# Patient Record
Sex: Female | Born: 1960 | Race: Black or African American | Hispanic: No | Marital: Single | Smoking: Former smoker
Health system: Southern US, Community
[De-identification: ages and names within clinical notes are randomized; demographics above are authoritative.]

## PROBLEM LIST (undated history)

## (undated) DIAGNOSIS — E213 Hyperparathyroidism, unspecified: Secondary | ICD-10-CM

## (undated) DIAGNOSIS — M1990 Unspecified osteoarthritis, unspecified site: Secondary | ICD-10-CM

## (undated) DIAGNOSIS — E559 Vitamin D deficiency, unspecified: Secondary | ICD-10-CM

## (undated) DIAGNOSIS — J45909 Unspecified asthma, uncomplicated: Secondary | ICD-10-CM

## (undated) DIAGNOSIS — E039 Hypothyroidism, unspecified: Secondary | ICD-10-CM

## (undated) DIAGNOSIS — F32A Depression, unspecified: Secondary | ICD-10-CM

## (undated) DIAGNOSIS — F329 Major depressive disorder, single episode, unspecified: Secondary | ICD-10-CM

---

## 2007-06-05 ENCOUNTER — Encounter: Admission: RE | Admit: 2007-06-05 | Discharge: 2007-06-05 | Payer: Self-pay | Admitting: Internal Medicine

## 2013-04-29 ENCOUNTER — Encounter (HOSPITAL_COMMUNITY): Payer: No Typology Code available for payment source | Admitting: Anesthesiology

## 2013-04-29 ENCOUNTER — Encounter (HOSPITAL_COMMUNITY): Payer: Self-pay | Admitting: Emergency Medicine

## 2013-04-29 ENCOUNTER — Inpatient Hospital Stay (HOSPITAL_COMMUNITY): Payer: No Typology Code available for payment source

## 2013-04-29 ENCOUNTER — Inpatient Hospital Stay (HOSPITAL_COMMUNITY)
Admission: EM | Admit: 2013-04-29 | Discharge: 2013-05-05 | DRG: 493 | Disposition: A | Discharge status: Rehabilitation Facility | Payer: No Typology Code available for payment source | Attending: General Surgery | Admitting: General Surgery

## 2013-04-29 ENCOUNTER — Emergency Department (HOSPITAL_COMMUNITY): Payer: No Typology Code available for payment source

## 2013-04-29 ENCOUNTER — Encounter (HOSPITAL_COMMUNITY): Admission: EM | Disposition: A | Discharge status: Rehabilitation Facility | Payer: Self-pay | Source: Home / Self Care

## 2013-04-29 ENCOUNTER — Inpatient Hospital Stay (HOSPITAL_COMMUNITY): Payer: No Typology Code available for payment source | Admitting: Anesthesiology

## 2013-04-29 DIAGNOSIS — S060XAA Concussion with loss of consciousness status unknown, initial encounter: Secondary | ICD-10-CM

## 2013-04-29 DIAGNOSIS — D62 Acute posthemorrhagic anemia: Secondary | ICD-10-CM | POA: Diagnosis present

## 2013-04-29 DIAGNOSIS — S2249XA Multiple fractures of ribs, unspecified side, initial encounter for closed fracture: Secondary | ICD-10-CM

## 2013-04-29 DIAGNOSIS — S060X9A Concussion with loss of consciousness of unspecified duration, initial encounter: Secondary | ICD-10-CM

## 2013-04-29 DIAGNOSIS — S32509A Unspecified fracture of unspecified pubis, initial encounter for closed fracture: Secondary | ICD-10-CM | POA: Diagnosis present

## 2013-04-29 DIAGNOSIS — S32592A Other specified fracture of left pubis, initial encounter for closed fracture: Secondary | ICD-10-CM

## 2013-04-29 DIAGNOSIS — S2241XA Multiple fractures of ribs, right side, initial encounter for closed fracture: Secondary | ICD-10-CM

## 2013-04-29 DIAGNOSIS — S02609A Fracture of mandible, unspecified, initial encounter for closed fracture: Secondary | ICD-10-CM

## 2013-04-29 DIAGNOSIS — S82409A Unspecified fracture of shaft of unspecified fibula, initial encounter for closed fracture: Secondary | ICD-10-CM

## 2013-04-29 DIAGNOSIS — S82209A Unspecified fracture of shaft of unspecified tibia, initial encounter for closed fracture: Secondary | ICD-10-CM | POA: Diagnosis present

## 2013-04-29 DIAGNOSIS — S02109A Fracture of base of skull, unspecified side, initial encounter for closed fracture: Secondary | ICD-10-CM | POA: Diagnosis present

## 2013-04-29 DIAGNOSIS — S02650A Fracture of angle of mandible, unspecified side, initial encounter for closed fracture: Secondary | ICD-10-CM | POA: Diagnosis present

## 2013-04-29 DIAGNOSIS — S0280XA Fracture of other specified skull and facial bones, unspecified side, initial encounter for closed fracture: Secondary | ICD-10-CM | POA: Diagnosis present

## 2013-04-29 DIAGNOSIS — Y92410 Unspecified street and highway as the place of occurrence of the external cause: Secondary | ICD-10-CM

## 2013-04-29 DIAGNOSIS — S01501A Unspecified open wound of lip, initial encounter: Secondary | ICD-10-CM | POA: Diagnosis present

## 2013-04-29 DIAGNOSIS — S02400A Malar fracture unspecified, initial encounter for closed fracture: Secondary | ICD-10-CM | POA: Diagnosis present

## 2013-04-29 DIAGNOSIS — S82251A Displaced comminuted fracture of shaft of right tibia, initial encounter for closed fracture: Secondary | ICD-10-CM

## 2013-04-29 DIAGNOSIS — S329XXA Fracture of unspecified parts of lumbosacral spine and pelvis, initial encounter for closed fracture: Secondary | ICD-10-CM

## 2013-04-29 DIAGNOSIS — N9489 Other specified conditions associated with female genital organs and menstrual cycle: Secondary | ICD-10-CM

## 2013-04-29 DIAGNOSIS — S82201A Unspecified fracture of shaft of right tibia, initial encounter for closed fracture: Secondary | ICD-10-CM

## 2013-04-29 DIAGNOSIS — S060X1A Concussion with loss of consciousness of 30 minutes or less, initial encounter: Secondary | ICD-10-CM

## 2013-04-29 DIAGNOSIS — S0292XA Unspecified fracture of facial bones, initial encounter for closed fracture: Secondary | ICD-10-CM

## 2013-04-29 DIAGNOSIS — S02401A Maxillary fracture, unspecified, initial encounter for closed fracture: Secondary | ICD-10-CM | POA: Diagnosis present

## 2013-04-29 DIAGNOSIS — IMO0002 Reserved for concepts with insufficient information to code with codable children: Secondary | ICD-10-CM | POA: Diagnosis present

## 2013-04-29 DIAGNOSIS — S21301A Unspecified open wound of right front wall of thorax with penetration into thoracic cavity, initial encounter: Secondary | ICD-10-CM

## 2013-04-29 DIAGNOSIS — Z79899 Other long term (current) drug therapy: Secondary | ICD-10-CM

## 2013-04-29 DIAGNOSIS — S82109A Unspecified fracture of upper end of unspecified tibia, initial encounter for closed fracture: Secondary | ICD-10-CM | POA: Diagnosis present

## 2013-04-29 DIAGNOSIS — J45909 Unspecified asthma, uncomplicated: Secondary | ICD-10-CM | POA: Diagnosis present

## 2013-04-29 DIAGNOSIS — M79609 Pain in unspecified limb: Secondary | ICD-10-CM | POA: Diagnosis present

## 2013-04-29 HISTORY — DX: Unspecified asthma, uncomplicated: J45.909

## 2013-04-29 HISTORY — DX: Unspecified fracture of facial bones, initial encounter for closed fracture: S02.92XA

## 2013-04-29 HISTORY — DX: Multiple fractures of ribs, right side, initial encounter for closed fracture: S22.41XA

## 2013-04-29 HISTORY — DX: Fracture of unspecified parts of lumbosacral spine and pelvis, initial encounter for closed fracture: S32.9XXA

## 2013-04-29 HISTORY — DX: Unspecified fracture of shaft of right tibia, initial encounter for closed fracture: S82.201A

## 2013-04-29 HISTORY — DX: Concussion with loss of consciousness status unknown, initial encounter: S06.0XAA

## 2013-04-29 HISTORY — PX: ORIF PELVIC FRACTURE: SHX2128

## 2013-04-29 HISTORY — PX: EXTERNAL FIXATION LEG: SHX1549

## 2013-04-29 LAB — COMPREHENSIVE METABOLIC PANEL
ALT: 31 U/L (ref 0–35)
Albumin: 3.4 g/dL — ABNORMAL LOW (ref 3.5–5.2)
Alkaline Phosphatase: 87 U/L (ref 39–117)
Calcium: 8.9 mg/dL (ref 8.4–10.5)
Chloride: 103 mEq/L (ref 96–112)
Creatinine, Ser: 0.62 mg/dL (ref 0.50–1.10)
GFR calc Af Amer: >90 mL/min (ref >90)
GFR calc non Af Amer: >90 mL/min (ref >90)
Glucose, Bld: 171 mg/dL — ABNORMAL HIGH (ref 70–99)
Total Bilirubin: 0.3 mg/dL (ref 0.3–1.2)
Total Protein: 6.8 g/dL (ref 6.0–8.3)

## 2013-04-29 LAB — CBC WITH DIFFERENTIAL/PLATELET
Basophils Relative: 0 % (ref 0–1)
HCT: 34.8 % — ABNORMAL LOW (ref 36.0–46.0)
MCH: 29.4 pg (ref 26.0–34.0)
MCHC: 33.6 g/dL (ref 30.0–36.0)
MCV: 87.4 fL (ref 78.0–100.0)
Monocytes Relative: 5 % (ref 3–12)
Neutro Abs: 5.5 10*3/uL (ref 1.7–7.7)
Neutrophils Relative %: 60 % (ref 43–77)
RBC: 3.98 MIL/uL (ref 3.87–5.11)
WBC: 9.1 10*3/uL (ref 4.0–10.5)

## 2013-04-29 LAB — MRSA PCR SCREENING: MRSA by PCR: NEGATIVE

## 2013-04-29 LAB — POCT I-STAT, CHEM 8
Calcium, Ion: 1.24 mmol/L — ABNORMAL HIGH (ref 1.12–1.23)
Sodium: 140 mEq/L (ref 135–145)
TCO2: 25 mmol/L (ref 0–100)

## 2013-04-29 SURGERY — OPEN REDUCTION INTERNAL FIXATION (ORIF) PELVIC FRACTURE
Anesthesia: General | Site: Leg Lower | Laterality: Right | Wound class: Clean

## 2013-04-29 MED ORDER — IOHEXOL 300 MG/ML  SOLN
100.0000 mL | Freq: Once | INTRAMUSCULAR | Status: AC | PRN
  Administered 2013-04-29: 80 mL via INTRAVENOUS

## 2013-04-29 MED ORDER — TETANUS-DIPHTH-ACELL PERTUSSIS 5-2.5-18.5 LF-MCG/0.5 IM SUSP
0.5000 mL | Freq: Once | INTRAMUSCULAR | Status: AC
  Administered 2013-04-29: 0.5 mL via INTRAMUSCULAR
  Filled 2013-04-29: qty 0.5

## 2013-04-29 MED ORDER — METHOCARBAMOL 500 MG PO TABS
500.0000 mg | ORAL_TABLET | Freq: Four times a day (QID) | ORAL | Status: DC | PRN
  Filled 2013-04-29: qty 1

## 2013-04-29 MED ORDER — FENTANYL CITRATE 0.05 MG/ML IJ SOLN
INTRAMUSCULAR | Status: AC
  Filled 2013-04-29: qty 2

## 2013-04-29 MED ORDER — ONDANSETRON HCL 4 MG/2ML IJ SOLN: 4.0000 mg | Freq: Four times a day (QID) | INTRAMUSCULAR | Status: DC | PRN

## 2013-04-29 MED ORDER — HYDROMORPHONE HCL PF 1 MG/ML IJ SOLN
0.2500 mg | INTRAMUSCULAR | Status: DC | PRN
  Administered 2013-04-29 (×4): 0.5 mg via INTRAVENOUS

## 2013-04-29 MED ORDER — CYCLOBENZAPRINE HCL 10 MG PO TABS
10.0000 mg | ORAL_TABLET | Freq: Three times a day (TID) | ORAL | Status: DC | PRN
  Filled 2013-04-29: qty 1

## 2013-04-29 MED ORDER — DIPHENHYDRAMINE HCL 50 MG/ML IJ SOLN: 12.5000 mg | Freq: Four times a day (QID) | INTRAMUSCULAR | Status: DC | PRN

## 2013-04-29 MED ORDER — FENTANYL CITRATE 0.05 MG/ML IJ SOLN
INTRAMUSCULAR | Status: DC | PRN
  Administered 2013-04-29 (×2): 100 ug via INTRAVENOUS
  Administered 2013-04-29: 50 ug via INTRAVENOUS

## 2013-04-29 MED ORDER — ONDANSETRON HCL 4 MG PO TABS: 4.0000 mg | ORAL_TABLET | Freq: Four times a day (QID) | ORAL | Status: DC | PRN

## 2013-04-29 MED ORDER — LIDOCAINE HCL (CARDIAC) 20 MG/ML IV SOLN
INTRAVENOUS | Status: DC | PRN
  Administered 2013-04-29: 80 mg via INTRAVENOUS

## 2013-04-29 MED ORDER — SODIUM CHLORIDE 0.9 % IV SOLN
10.0000 mg | INTRAVENOUS | Status: DC | PRN
  Administered 2013-04-29: 40 ug/min via INTRAVENOUS

## 2013-04-29 MED ORDER — METOCLOPRAMIDE HCL 5 MG/ML IJ SOLN
INTRAMUSCULAR | Status: DC | PRN
  Administered 2013-04-29: 10 mg via INTRAVENOUS

## 2013-04-29 MED ORDER — HYDROMORPHONE HCL PF 1 MG/ML IJ SOLN: 1.0000 mg | INTRAMUSCULAR | Status: DC | PRN

## 2013-04-29 MED ORDER — PROMETHAZINE HCL 25 MG/ML IJ SOLN: 6.2500 mg | INTRAMUSCULAR | Status: DC | PRN

## 2013-04-29 MED ORDER — BIOTENE DRY MOUTH MT LIQD
15.0000 mL | Freq: Two times a day (BID) | OROMUCOSAL | Status: DC
  Administered 2013-04-29 – 2013-05-05 (×10): 15 mL via OROMUCOSAL

## 2013-04-29 MED ORDER — PANTOPRAZOLE SODIUM 40 MG IV SOLR
40.0000 mg | Freq: Every day | INTRAVENOUS | Status: DC
  Administered 2013-04-29 – 2013-05-02 (×3): 40 mg via INTRAVENOUS
  Filled 2013-04-29 (×4): qty 40

## 2013-04-29 MED ORDER — OXYCODONE HCL 5 MG PO TABS
5.0000 mg | ORAL_TABLET | ORAL | Status: DC | PRN
  Administered 2013-05-01 – 2013-05-02 (×2): 10 mg via ORAL
  Administered 2013-05-02: 5 mg via ORAL
  Filled 2013-04-29 (×2): qty 2
  Filled 2013-04-29: qty 1

## 2013-04-29 MED ORDER — DIPHENHYDRAMINE HCL 12.5 MG/5ML PO ELIX
12.5000 mg | ORAL_SOLUTION | ORAL | Status: DC | PRN
  Filled 2013-04-29: qty 10

## 2013-04-29 MED ORDER — METOCLOPRAMIDE HCL 5 MG/ML IJ SOLN
5.0000 mg | Freq: Three times a day (TID) | INTRAMUSCULAR | Status: DC | PRN
  Filled 2013-04-29: qty 2

## 2013-04-29 MED ORDER — SODIUM CHLORIDE 0.9 % IV BOLUS (SEPSIS)
1000.0000 mL | Freq: Once | INTRAVENOUS | Status: AC
  Administered 2013-04-29: 1000 mL via INTRAVENOUS

## 2013-04-29 MED ORDER — KCL IN DEXTROSE-NACL 20-5-0.45 MEQ/L-%-% IV SOLN
INTRAVENOUS | Status: DC
  Administered 2013-04-29 – 2013-05-02 (×8): via INTRAVENOUS
  Filled 2013-04-29 (×12): qty 1000

## 2013-04-29 MED ORDER — OXYCODONE HCL 5 MG PO TABS: 5.0000 mg | ORAL_TABLET | Freq: Once | ORAL | Status: DC | PRN

## 2013-04-29 MED ORDER — ONDANSETRON HCL 4 MG/2ML IJ SOLN
INTRAMUSCULAR | Status: DC | PRN
  Administered 2013-04-29: 4 mg via INTRAMUSCULAR

## 2013-04-29 MED ORDER — OXYCODONE HCL 5 MG/5ML PO SOLN: 5.0000 mg | Freq: Once | ORAL | Status: DC | PRN

## 2013-04-29 MED ORDER — PROPOFOL 10 MG/ML IV BOLUS
INTRAVENOUS | Status: DC | PRN
  Administered 2013-04-29: 20 mg via INTRAVENOUS
  Administered 2013-04-29: 100 mg via INTRAVENOUS
  Administered 2013-04-29: 50 mg via INTRAVENOUS
  Administered 2013-04-29: 30 mg via INTRAVENOUS

## 2013-04-29 MED ORDER — NALOXONE HCL 0.4 MG/ML IJ SOLN: 0.4000 mg | INTRAMUSCULAR | Status: DC | PRN

## 2013-04-29 MED ORDER — HYDROMORPHONE 0.3 MG/ML IV SOLN
INTRAVENOUS | Status: DC
  Administered 2013-04-29: 18:00:00 via INTRAVENOUS
  Administered 2013-04-30: 0.999 mg via INTRAVENOUS
  Administered 2013-04-30: 0.4 mg via INTRAVENOUS
  Administered 2013-04-30: 0.39 mg via INTRAVENOUS
  Administered 2013-05-01: 0.799 mg via INTRAVENOUS
  Administered 2013-05-01: 0.2 mg via INTRAVENOUS
  Administered 2013-05-01: 0.6 mg via INTRAVENOUS
  Administered 2013-05-01: 0.999 mg via INTRAVENOUS
  Administered 2013-05-02: 5.99 mg via INTRAVENOUS
  Administered 2013-05-02: 0.599 mg via INTRAVENOUS
  Administered 2013-05-02: via INTRAVENOUS
  Administered 2013-05-02 (×2): 0.599 mg via INTRAVENOUS
  Administered 2013-05-02: 0.6 mg via INTRAVENOUS
  Administered 2013-05-02: 2 mg via INTRAVENOUS
  Administered 2013-05-03: 0.3999 mg via INTRAVENOUS
  Administered 2013-05-03: 0.2 mg via INTRAVENOUS
  Administered 2013-05-03: 0.999 mg via INTRAVENOUS
  Filled 2013-04-29: qty 25

## 2013-04-29 MED ORDER — ROCURONIUM BROMIDE 100 MG/10ML IV SOLN
INTRAVENOUS | Status: DC | PRN
  Administered 2013-04-29: 10 mg via INTRAVENOUS
  Administered 2013-04-29: 30 mg via INTRAVENOUS
  Administered 2013-04-29: 10 mg via INTRAVENOUS
  Administered 2013-04-29: 50 mg via INTRAVENOUS

## 2013-04-29 MED ORDER — CEFAZOLIN SODIUM 1-5 GM-% IV SOLN
1.0000 g | Freq: Four times a day (QID) | INTRAVENOUS | Status: AC
  Administered 2013-04-29 – 2013-04-30 (×3): 1 g via INTRAVENOUS
  Filled 2013-04-29 (×5): qty 50

## 2013-04-29 MED ORDER — HYDROMORPHONE 0.3 MG/ML IV SOLN
INTRAVENOUS | Status: AC
  Filled 2013-04-29: qty 25

## 2013-04-29 MED ORDER — ENOXAPARIN SODIUM 40 MG/0.4ML ~~LOC~~ SOLN
40.0000 mg | SUBCUTANEOUS | Status: DC
  Administered 2013-04-30: 40 mg via SUBCUTANEOUS
  Filled 2013-04-29: qty 0.4

## 2013-04-29 MED ORDER — HYDROMORPHONE HCL PF 1 MG/ML IJ SOLN
INTRAMUSCULAR | Status: AC
  Filled 2013-04-29: qty 1

## 2013-04-29 MED ORDER — SODIUM CHLORIDE 0.9 % IR SOLN
Status: DC | PRN
  Administered 2013-04-29: 1000 mL

## 2013-04-29 MED ORDER — PHENYLEPHRINE HCL 10 MG/ML IJ SOLN
INTRAMUSCULAR | Status: DC | PRN
  Administered 2013-04-29: 80 ug via INTRAVENOUS
  Administered 2013-04-29: 160 ug via INTRAVENOUS
  Administered 2013-04-29 (×2): 80 ug via INTRAVENOUS

## 2013-04-29 MED ORDER — SODIUM CHLORIDE 0.9 % IJ SOLN: 9.0000 mL | INTRAMUSCULAR | Status: DC | PRN

## 2013-04-29 MED ORDER — MIDAZOLAM HCL 5 MG/5ML IJ SOLN
INTRAMUSCULAR | Status: DC | PRN
  Administered 2013-04-29 (×2): 2 mg via INTRAVENOUS

## 2013-04-29 MED ORDER — ALBUTEROL SULFATE HFA 108 (90 BASE) MCG/ACT IN AERS
2.0000 | INHALATION_SPRAY | Freq: Four times a day (QID) | RESPIRATORY_TRACT | Status: DC | PRN
  Filled 2013-04-29: qty 6.7

## 2013-04-29 MED ORDER — LACTATED RINGERS IV SOLN
INTRAVENOUS | Status: DC | PRN
  Administered 2013-04-29 (×3): via INTRAVENOUS

## 2013-04-29 MED ORDER — IOHEXOL 300 MG/ML  SOLN: 100.0000 mL | Freq: Once | INTRAMUSCULAR | Status: DC | PRN

## 2013-04-29 MED ORDER — ALBUMIN HUMAN 5 % IV SOLN
INTRAVENOUS | Status: DC | PRN
  Administered 2013-04-29 (×2): via INTRAVENOUS

## 2013-04-29 MED ORDER — FENTANYL CITRATE 0.05 MG/ML IJ SOLN
75.0000 ug | Freq: Once | INTRAMUSCULAR | Status: AC
  Administered 2013-04-29: 75 ug via INTRAVENOUS

## 2013-04-29 MED ORDER — METOCLOPRAMIDE HCL 5 MG PO TABS
5.0000 mg | ORAL_TABLET | Freq: Three times a day (TID) | ORAL | Status: DC | PRN
  Filled 2013-04-29: qty 2

## 2013-04-29 MED ORDER — NEOSTIGMINE METHYLSULFATE 1 MG/ML IJ SOLN
INTRAMUSCULAR | Status: DC | PRN
  Administered 2013-04-29: 5 mg via INTRAVENOUS

## 2013-04-29 MED ORDER — CEFAZOLIN SODIUM-DEXTROSE 2-3 GM-% IV SOLR
2.0000 g | Freq: Once | INTRAVENOUS | Status: AC
  Administered 2013-04-29: 2 g via INTRAVENOUS
  Filled 2013-04-29: qty 50

## 2013-04-29 MED ORDER — DIPHENHYDRAMINE HCL 12.5 MG/5ML PO ELIX
12.5000 mg | ORAL_SOLUTION | Freq: Four times a day (QID) | ORAL | Status: DC | PRN
  Filled 2013-04-29: qty 5

## 2013-04-29 MED ORDER — PANTOPRAZOLE SODIUM 40 MG PO TBEC: 40.0000 mg | DELAYED_RELEASE_TABLET | Freq: Every day | ORAL | Status: DC

## 2013-04-29 MED ORDER — METHOCARBAMOL 100 MG/ML IJ SOLN
500.0000 mg | Freq: Four times a day (QID) | INTRAVENOUS | Status: DC | PRN
  Filled 2013-04-29: qty 5

## 2013-04-29 MED ORDER — GLYCOPYRROLATE 0.2 MG/ML IJ SOLN
INTRAMUSCULAR | Status: DC | PRN
  Administered 2013-04-29: .8 mg via INTRAVENOUS

## 2013-04-29 SURGICAL SUPPLY — 94 items
BANDAGE ELASTIC 4 VELCRO ST LF (GAUZE/BANDAGES/DRESSINGS) ×3 IMPLANT
BANDAGE ELASTIC 6 VELCRO ST LF (GAUZE/BANDAGES/DRESSINGS) ×3 IMPLANT
BANDAGE ESMARK 6X9 LF (GAUZE/BANDAGES/DRESSINGS) IMPLANT
BANDAGE GAUZE ELAST BULKY 4 IN (GAUZE/BANDAGES/DRESSINGS) ×6 IMPLANT
BAR GLASS FIBER EXFX 11X500 (MISCELLANEOUS) ×6 IMPLANT
BIT DRILL MATTA 2.5MX180M (BIT) ×2 IMPLANT
BLADE SURG ROTATE 9660 (MISCELLANEOUS) ×3 IMPLANT
BNDG COHESIVE 6X5 TAN STRL LF (GAUZE/BANDAGES/DRESSINGS) ×3 IMPLANT
BNDG ESMARK 6X9 LF (GAUZE/BANDAGES/DRESSINGS)
BRUSH SCRUB DISP (MISCELLANEOUS) ×12 IMPLANT
CATH FOLEY 2WAY SLVR  5CC 14FR (CATHETERS) ×1
CATH FOLEY 2WAY SLVR 5CC 14FR (CATHETERS) ×2 IMPLANT
CLEANER TIP ELECTROSURG 2X2 (MISCELLANEOUS) ×3 IMPLANT
CLOTH BEACON ORANGE TIMEOUT ST (SAFETY) ×3 IMPLANT
COVER SURGICAL LIGHT HANDLE (MISCELLANEOUS) ×3 IMPLANT
CUFF TOURNIQUET SINGLE 18IN (TOURNIQUET CUFF) IMPLANT
CUFF TOURNIQUET SINGLE 24IN (TOURNIQUET CUFF) IMPLANT
CUFF TOURNIQUET SINGLE 34IN LL (TOURNIQUET CUFF) IMPLANT
DRAIN CHANNEL 15F RND FF W/TCR (WOUND CARE) ×3 IMPLANT
DRAPE C-ARM 42X72 X-RAY (DRAPES) ×3 IMPLANT
DRAPE C-ARMOR (DRAPES) ×3 IMPLANT
DRAPE INCISE IOBAN 66X45 STRL (DRAPES) ×3 IMPLANT
DRAPE ORTHO SPLIT 77X108 STRL (DRAPES) ×2
DRAPE SURG ORHT 6 SPLT 77X108 (DRAPES) ×4 IMPLANT
DRAPE U-SHAPE 47X51 STRL (DRAPES) ×3 IMPLANT
DRILL BIT MATTA 2.5MX180M (BIT) ×3
DRSG ADAPTIC 3X8 NADH LF (GAUZE/BANDAGES/DRESSINGS) IMPLANT
DRSG MEPILEX BORDER 4X8 (GAUZE/BANDAGES/DRESSINGS) ×3 IMPLANT
DRSG PAD ABDOMINAL 8X10 ST (GAUZE/BANDAGES/DRESSINGS) IMPLANT
ELECT REM PT RETURN 9FT ADLT (ELECTROSURGICAL) ×3
ELECTRODE REM PT RTRN 9FT ADLT (ELECTROSURGICAL) ×2 IMPLANT
EVACUATOR 1/8 PVC DRAIN (DRAIN) IMPLANT
EVACUATOR SILICONE 100CC (DRAIN) ×3 IMPLANT
GLOVE BIO SURGEON STRL SZ 6.5 (GLOVE) ×3 IMPLANT
GLOVE BIO SURGEON STRL SZ7.5 (GLOVE) ×9 IMPLANT
GLOVE BIO SURGEON STRL SZ8 (GLOVE) ×3 IMPLANT
GLOVE BIOGEL PI IND STRL 7.0 (GLOVE) ×2 IMPLANT
GLOVE BIOGEL PI IND STRL 7.5 (GLOVE) ×8 IMPLANT
GLOVE BIOGEL PI IND STRL 8 (GLOVE) ×2 IMPLANT
GLOVE BIOGEL PI INDICATOR 7.0 (GLOVE) ×1
GLOVE BIOGEL PI INDICATOR 7.5 (GLOVE) ×4
GLOVE BIOGEL PI INDICATOR 8 (GLOVE) ×1
GLOVE SURG SS PI 6.5 STRL IVOR (GLOVE) ×3 IMPLANT
GOWN PREVENTION PLUS XLARGE (GOWN DISPOSABLE) ×3 IMPLANT
GOWN SRG XL XLNG 56XLVL 4 (GOWN DISPOSABLE) ×4 IMPLANT
GOWN STRL NON-REIN LRG LVL3 (GOWN DISPOSABLE) ×9 IMPLANT
GOWN STRL NON-REIN XL XLG LVL4 (GOWN DISPOSABLE) ×2
HALF PIN 5.0X160 (PIN) ×6 IMPLANT
HANDPIECE INTERPULSE COAX TIP (DISPOSABLE)
KIT BASIN OR (CUSTOM PROCEDURE TRAY) ×3 IMPLANT
KIT ROOM TURNOVER OR (KITS) ×3 IMPLANT
MANIFOLD NEPTUNE II (INSTRUMENTS) ×3 IMPLANT
NEEDLE 22X1 1/2 (OR ONLY) (NEEDLE) IMPLANT
NS IRRIG 1000ML POUR BTL (IV SOLUTION) ×3 IMPLANT
PACK ORTHO EXTREMITY (CUSTOM PROCEDURE TRAY) IMPLANT
PACK TOTAL JOINT (CUSTOM PROCEDURE TRAY) ×3 IMPLANT
PAD ARMBOARD 7.5X6 YLW CONV (MISCELLANEOUS) ×6 IMPLANT
PADDING CAST COTTON 6X4 STRL (CAST SUPPLIES) IMPLANT
PIN CLAMP 2BAR 75MM BLUE (PIN) ×6 IMPLANT
PIN HALF YELLOW 5X160X35 (PIN) ×6 IMPLANT
PLATE SYMPHYSIS 92.5M 6H (Plate) ×3 IMPLANT
SCREW 3.5X46MM (Screw) ×3 IMPLANT
SCREW CORT 48X3.5XST NS LF (Screw) ×2 IMPLANT
SCREW CORTEX ST MATTA 3.5X24 (Screw) ×3 IMPLANT
SCREW CORTEX ST MATTA 3.5X26MM (Screw) ×3 IMPLANT
SCREW CORTEX ST MATTA 3.5X32MM (Screw) ×3 IMPLANT
SCREW CORTEX ST MATTA 3.5X40MM (Screw) ×3 IMPLANT
SCREW CORTEX ST MATTA 3.5X50MM (Screw) ×3 IMPLANT
SCREW CORTICAL 3.5X42MM (Screw) ×3 IMPLANT
SCREW CORTICAL 3.5X48MM (Screw) ×1 IMPLANT
SET HNDPC FAN SPRY TIP SCT (DISPOSABLE) IMPLANT
SET MONITOR QUICK PRESSURE (MISCELLANEOUS) ×3 IMPLANT
SPONGE GAUZE 4X4 12PLY (GAUZE/BANDAGES/DRESSINGS) ×3 IMPLANT
SPONGE LAP 18X18 X RAY DECT (DISPOSABLE) IMPLANT
SPONGE SCRUB IODOPHOR (GAUZE/BANDAGES/DRESSINGS) ×3 IMPLANT
STAPLER VISISTAT 35W (STAPLE) ×3 IMPLANT
STOCKINETTE IMPERVIOUS LG (DRAPES) ×3 IMPLANT
STRIP CLOSURE SKIN 1/2X4 (GAUZE/BANDAGES/DRESSINGS) IMPLANT
SUCTION FRAZIER TIP 10 FR DISP (SUCTIONS) ×3 IMPLANT
SUT ETHILON 3 0 PS 1 (SUTURE) ×3 IMPLANT
SUT VIC AB 0 CT1 27 (SUTURE) ×2
SUT VIC AB 0 CT1 27XBRD ANBCTR (SUTURE) ×4 IMPLANT
SUT VIC AB 1 CT1 18XCR BRD 8 (SUTURE) ×2 IMPLANT
SUT VIC AB 1 CT1 8-18 (SUTURE) ×1
SUT VIC AB 2-0 CT1 27 (SUTURE) ×2
SUT VIC AB 2-0 CT1 TAPERPNT 27 (SUTURE) ×4 IMPLANT
SYR CONTROL 10ML LL (SYRINGE) IMPLANT
TOWEL OR 17X24 6PK STRL BLUE (TOWEL DISPOSABLE) ×6 IMPLANT
TOWEL OR 17X26 10 PK STRL BLUE (TOWEL DISPOSABLE) ×6 IMPLANT
TRAY FOLEY CATH 16FRSI W/METER (SET/KITS/TRAYS/PACK) ×3 IMPLANT
TUBE CONNECTING 12X1/4 (SUCTIONS) ×3 IMPLANT
UNDERPAD 30X30 INCONTINENT (UNDERPADS AND DIAPERS) ×3 IMPLANT
WATER STERILE IRR 1000ML POUR (IV SOLUTION) ×3 IMPLANT
YANKAUER SUCT BULB TIP NO VENT (SUCTIONS) ×3 IMPLANT
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
## 2013-04-29 NOTE — Anesthesia Procedure Notes (Signed)
Procedure Name: Intubation Date/Time: 04/29/2013 2:21 PM Performed by: Whitman Hero Pre-anesthesia Checklist: Patient identified, Timeout performed, Emergency Drugs available, Suction available and Patient being monitored Patient Re-evaluated:Patient Re-evaluated prior to inductionOxygen Delivery Method: Circle system utilized Preoxygenation: Pre-oxygenation with 100% oxygen Intubation Type: IV induction Ventilation: Mask ventilation without difficulty Laryngoscope Size: Mac and 3 Grade View: Grade I Tube type: Oral Tube size: 7.5 mm Number of attempts: 1 Airway Equipment and Method: Patient positioned with wedge pillow and Stylet Placement Confirmation: ETT inserted through vocal cords under direct vision,  breath sounds checked- equal and bilateral and positive ETCO2 Secured at: 22 cm Tube secured with: Tape Dental Injury: Teeth and Oropharynx as per pre-operative assessment
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
## 2013-04-29 NOTE — Progress Notes (Signed)
I have seen and examined the patient. I agree with the findings above.   Pelvic ring diastasis and right tib-fib fracture  LLE Sens DPN, SPN, TN intact  Motor EHL, ext, flex, evers 5/5  DP 2+, PT 2+ RLE Tender and swollen proximally but no pain with passive stretch  Sens DPN, SPN, TN intact  Motor EHL, ext, flex, evers grossly intact  DP 2+, PT 2+ Bloody knuckles bilaterally but no ecchymosis, reduction in motion, or discrete tenderness of significance   I discussed with the patient and her husband the risks and benefits of surgery of the pelvis and right tibia, including the possibility of infection, urologic injury, nerve injury, vessel injury, wound breakdown, arthritis, symptomatic hardware, DVT/ PE, compartment syndrome, loss of motion, and need for further surgery among others.  We also specifically discussed the need to stage surgery because of the elevated risk of soft tissue breakdown that could lead to amputation.  She and her husband understood these risks and wished to proceed.  Budd Palmer, MD 04/29/2013 2:16 PM

## 2013-04-29 NOTE — ED Notes (Signed)
Pt arrived via [facility] on [facility] with c-collar intact; pt's clothes cut by [facility], removed clothes, placed pt on monitor, continuous pulse oximetry and blood pressure cuff; vitals and EKG performed
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
## 2013-04-29 NOTE — Anesthesia Postprocedure Evaluation (Signed)
  Anesthesia Post-op Note  Patient: [name]  Procedure(s) Performed: Procedure(s): OPEN REDUCTION INTERNAL FIXATION (ORIF) PELVIC FRACTURE (N/A) EXTERNAL FIXATION LEG (Right)  Patient Location: PACU  Anesthesia Type:General  Level of Consciousness: awake, alert  and oriented  Airway and Oxygen Therapy: Patient Spontanous Breathing and Patient connected to nasal cannula oxygen  Post-op Pain: none  Post-op Assessment: Post-op Vital signs reviewed  Post-op Vital Signs: Reviewed  Complications: No apparent anesthesia complications

## 2013-04-29 NOTE — Progress Notes (Signed)
Orthopedic Tech Progress Note Patient Details:  [name] 1961/01/11 [id number] Unable to use at this time c-spine has not cleared.  Patient ID: [name], female   DOB: [birth year], 52 y.o.   MRN: [id number]   [name] 04/29/2013, 8:36 PM
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
## 2013-04-29 NOTE — Transfer of Care (Signed)
Immediate Anesthesia Transfer of Care Note  Patient: [name]  Procedure(s) Performed: Procedure(s): OPEN REDUCTION INTERNAL FIXATION (ORIF) PELVIC FRACTURE (N/A) EXTERNAL FIXATION LEG (Right)  Patient Location: PACU  Anesthesia Type:General  Level of Consciousness: awake and patient cooperative  Airway & Oxygen Therapy: Patient Spontanous Breathing and Patient connected to face mask oxygen  Post-op Assessment: Report given to PACU RN and Post -op Vital signs reviewed and stable  Post vital signs: Reviewed and stable  Complications: No apparent anesthesia complications
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
## 2013-04-29 NOTE — ED Notes (Signed)
Trauma end  

## 2013-04-29 NOTE — ED Notes (Signed)
Patient returned from radiology

## 2013-04-29 NOTE — ED Notes (Signed)
Temp in room increased for patient comfort. Warm blankets placed on patient.

## 2013-04-29 NOTE — ED Notes (Addendum)
Assisted Crystal, RN with placing pt on fracture pan to attempt to urinate; pt was unable to bear her weight on the fracture pan and asked to have it removed; pan was removed
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
## 2013-04-29 NOTE — H&P (Signed)
Patient examined and I agree with the assessment and plan  Violeta Gelinas, MD, MPH, FACS Pager: [phone]  04/29/2013 5:14 PM
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
## 2013-04-30 DIAGNOSIS — S32509A Unspecified fracture of unspecified pubis, initial encounter for closed fracture: Secondary | ICD-10-CM | POA: Diagnosis not present

## 2013-04-30 DIAGNOSIS — M79609 Pain in unspecified limb: Secondary | ICD-10-CM | POA: Diagnosis not present

## 2013-04-30 DIAGNOSIS — D62 Acute posthemorrhagic anemia: Secondary | ICD-10-CM

## 2013-04-30 DIAGNOSIS — S060X9A Concussion with loss of consciousness of unspecified duration, initial encounter: Secondary | ICD-10-CM

## 2013-04-30 LAB — CBC
HCT: 21.5 % — ABNORMAL LOW (ref 36.0–46.0)
Hemoglobin: 7.2 g/dL — ABNORMAL LOW (ref 12.0–15.0)
MCHC: 33.5 g/dL (ref 30.0–36.0)
MCV: 87.4 fL (ref 78.0–100.0)
Platelets: 152 10*3/uL (ref 150–400)
RBC: 2.46 MIL/uL — ABNORMAL LOW (ref 3.87–5.11)
WBC: 8.5 10*3/uL (ref 4.0–10.5)

## 2013-04-30 LAB — BASIC METABOLIC PANEL
CO2: 23 mEq/L (ref 19–32)
Calcium: 8.2 mg/dL — ABNORMAL LOW (ref 8.4–10.5)
Chloride: 102 mEq/L (ref 96–112)
Creatinine, Ser: 0.58 mg/dL (ref 0.50–1.10)
Glucose, Bld: 161 mg/dL — ABNORMAL HIGH (ref 70–99)

## 2013-04-30 MED ORDER — CEFAZOLIN SODIUM-DEXTROSE 2-3 GM-% IV SOLR
2.0000 g | Freq: Once | INTRAVENOUS | Status: AC
  Administered 2013-05-01: 1 g via INTRAVENOUS
  Filled 2013-04-30: qty 50

## 2013-04-30 MED ORDER — ENOXAPARIN SODIUM 40 MG/0.4ML ~~LOC~~ SOLN
40.0000 mg | SUBCUTANEOUS | Status: DC
  Administered 2013-05-02 – 2013-05-05 (×4): 40 mg via SUBCUTANEOUS
  Filled 2013-04-30 (×4): qty 0.4

## 2013-04-30 NOTE — Progress Notes (Signed)
Awake and alert.  Doing well.  Pain is controlled.  This patient has been seen and I agree with the findings and treatment plan.  Marta Lamas. Gae Bon, MD, FACS [phone] (pager) [phone] (direct pager) Trauma Surgeon

## 2013-04-30 NOTE — Progress Notes (Signed)
PT Cancellation Note  Patient Details Name: [name] MRN: [id number] DOB: 1961-06-09   Cancelled Treatment:    Reason Eval/Treat Not Completed: Patient not medically ready.  Spoke with RN and will check back tomorrow.     Sunny Schlein, [place] [phone] 04/30/2013, 11:03 AM
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
## 2013-05-01 ENCOUNTER — Inpatient Hospital Stay (HOSPITAL_COMMUNITY): Payer: No Typology Code available for payment source

## 2013-05-01 ENCOUNTER — Encounter (HOSPITAL_COMMUNITY): Payer: Self-pay | Admitting: Anesthesiology

## 2013-05-01 ENCOUNTER — Inpatient Hospital Stay (HOSPITAL_COMMUNITY): Payer: No Typology Code available for payment source | Admitting: Anesthesiology

## 2013-05-01 ENCOUNTER — Encounter (HOSPITAL_COMMUNITY): Admission: EM | Disposition: A | Discharge status: Rehabilitation Facility | Payer: Self-pay | Source: Home / Self Care

## 2013-05-01 ENCOUNTER — Encounter (HOSPITAL_COMMUNITY): Payer: No Typology Code available for payment source | Admitting: Anesthesiology

## 2013-05-01 DIAGNOSIS — M79609 Pain in unspecified limb: Secondary | ICD-10-CM | POA: Diagnosis not present

## 2013-05-01 DIAGNOSIS — S32509A Unspecified fracture of unspecified pubis, initial encounter for closed fracture: Secondary | ICD-10-CM | POA: Diagnosis not present

## 2013-05-01 HISTORY — PX: TIBIA IM NAIL INSERTION: SHX2516

## 2013-05-01 LAB — TYPE AND SCREEN
ABO/RH(D): O POS
Unit division: 0
Unit division: 0

## 2013-05-01 LAB — CBC
Hemoglobin: 9.7 g/dL — ABNORMAL LOW (ref 12.0–15.0)
MCH: 30.4 pg (ref 26.0–34.0)
MCHC: 34.4 g/dL (ref 30.0–36.0)
Platelets: 122 10*3/uL — ABNORMAL LOW (ref 150–400)
RDW: 14.1 % (ref 11.5–15.5)
WBC: 12 10*3/uL — ABNORMAL HIGH (ref 4.0–10.5)

## 2013-05-01 LAB — URINALYSIS, ROUTINE W REFLEX MICROSCOPIC
Glucose, UA: NEGATIVE mg/dL
Ketones, ur: NEGATIVE mg/dL
Nitrite: NEGATIVE
Protein, ur: NEGATIVE mg/dL
Urobilinogen, UA: 0.2 mg/dL (ref 0.0–1.0)
pH: 6.5 (ref 5.0–8.0)

## 2013-05-01 LAB — BASIC METABOLIC PANEL
BUN: 5 mg/dL — ABNORMAL LOW (ref 6–23)
Calcium: 8.5 mg/dL (ref 8.4–10.5)
Creatinine, Ser: 0.54 mg/dL (ref 0.50–1.10)
GFR calc Af Amer: >90 mL/min (ref >90)
GFR calc non Af Amer: >90 mL/min (ref >90)
Glucose, Bld: 142 mg/dL — ABNORMAL HIGH (ref 70–99)
Potassium: 4 mEq/L (ref 3.5–5.1)

## 2013-05-01 LAB — URINE MICROSCOPIC-ADD ON

## 2013-05-01 SURGERY — INSERTION, INTRAMEDULLARY ROD, TIBIA
Anesthesia: General | Site: Leg Lower | Laterality: Right | Wound class: Clean

## 2013-05-01 MED ORDER — HYDROMORPHONE HCL PF 1 MG/ML IJ SOLN
0.2500 mg | INTRAMUSCULAR | Status: DC | PRN
  Administered 2013-05-01 (×2): 0.5 mg via INTRAVENOUS

## 2013-05-01 MED ORDER — HYDROMORPHONE HCL PF 1 MG/ML IJ SOLN
INTRAMUSCULAR | Status: AC
  Administered 2013-05-01: 0.5 mg via INTRAVENOUS
  Filled 2013-05-01: qty 1

## 2013-05-01 MED ORDER — CEFAZOLIN SODIUM 1-5 GM-% IV SOLN
INTRAVENOUS | Status: AC
  Filled 2013-05-01: qty 50

## 2013-05-01 MED ORDER — WHITE PETROLATUM GEL
Status: AC
  Administered 2013-05-01: 0.2
  Filled 2013-05-01: qty 5

## 2013-05-01 MED ORDER — PHENYLEPHRINE HCL 10 MG/ML IJ SOLN
INTRAMUSCULAR | Status: DC | PRN
  Administered 2013-05-01 (×4): 80 ug via INTRAVENOUS

## 2013-05-01 MED ORDER — CEFAZOLIN SODIUM 1-5 GM-% IV SOLN
1.0000 g | Freq: Three times a day (TID) | INTRAVENOUS | Status: AC
  Administered 2013-05-01 – 2013-05-02 (×3): 1 g via INTRAVENOUS
  Filled 2013-05-01 (×3): qty 50

## 2013-05-01 MED ORDER — ROCURONIUM BROMIDE 100 MG/10ML IV SOLN
INTRAVENOUS | Status: DC | PRN
  Administered 2013-05-01: 20 mg via INTRAVENOUS

## 2013-05-01 MED ORDER — OXYCODONE HCL 5 MG PO TABS: 5.0000 mg | ORAL_TABLET | Freq: Once | ORAL | Status: AC | PRN

## 2013-05-01 MED ORDER — FENTANYL CITRATE 0.05 MG/ML IJ SOLN
INTRAMUSCULAR | Status: DC | PRN
  Administered 2013-05-01: 50 ug via INTRAVENOUS
  Administered 2013-05-01: 100 ug via INTRAVENOUS
  Administered 2013-05-01 (×3): 50 ug via INTRAVENOUS

## 2013-05-01 MED ORDER — OXYCODONE HCL 5 MG/5ML PO SOLN
ORAL | Status: AC
  Administered 2013-05-01: 5 mg via ORAL
  Filled 2013-05-01: qty 5

## 2013-05-01 MED ORDER — PROPOFOL 10 MG/ML IV BOLUS
INTRAVENOUS | Status: DC | PRN
  Administered 2013-05-01: 150 mg via INTRAVENOUS

## 2013-05-01 MED ORDER — 0.9 % SODIUM CHLORIDE (POUR BTL) OPTIME
TOPICAL | Status: DC | PRN
  Administered 2013-05-01: 1000 mL

## 2013-05-01 MED ORDER — KCL IN DEXTROSE-NACL 20-5-0.45 MEQ/L-%-% IV SOLN
INTRAVENOUS | Status: AC
  Filled 2013-05-01: qty 1000

## 2013-05-01 MED ORDER — PROMETHAZINE HCL 25 MG/ML IJ SOLN: 6.2500 mg | INTRAMUSCULAR | Status: DC | PRN

## 2013-05-01 MED ORDER — LACTATED RINGERS IV SOLN
INTRAVENOUS | Status: DC | PRN
  Administered 2013-05-01 (×2): via INTRAVENOUS

## 2013-05-01 MED ORDER — MIDAZOLAM HCL 5 MG/5ML IJ SOLN
INTRAMUSCULAR | Status: DC | PRN
  Administered 2013-05-01: 2 mg via INTRAVENOUS

## 2013-05-01 MED ORDER — NEOSTIGMINE METHYLSULFATE 1 MG/ML IJ SOLN
INTRAMUSCULAR | Status: DC | PRN
  Administered 2013-05-01: 3 mg via INTRAVENOUS

## 2013-05-01 MED ORDER — OXYCODONE HCL 5 MG/5ML PO SOLN
5.0000 mg | Freq: Once | ORAL | Status: AC | PRN
  Administered 2013-05-01: 5 mg via ORAL

## 2013-05-01 MED ORDER — SUCCINYLCHOLINE CHLORIDE 20 MG/ML IJ SOLN
INTRAMUSCULAR | Status: DC | PRN
  Administered 2013-05-01: 100 mg via INTRAVENOUS

## 2013-05-01 MED ORDER — GLYCOPYRROLATE 0.2 MG/ML IJ SOLN
INTRAMUSCULAR | Status: DC | PRN
  Administered 2013-05-01: 0.4 mg via INTRAVENOUS

## 2013-05-01 MED ORDER — ONDANSETRON HCL 4 MG/2ML IJ SOLN
INTRAMUSCULAR | Status: DC | PRN
  Administered 2013-05-01: 4 mg via INTRAVENOUS

## 2013-05-01 SURGICAL SUPPLY — 59 items
BANDAGE ELASTIC 4 VELCRO ST LF (GAUZE/BANDAGES/DRESSINGS) ×2 IMPLANT
BANDAGE ELASTIC 6 VELCRO ST LF (GAUZE/BANDAGES/DRESSINGS) ×2 IMPLANT
BANDAGE GAUZE ELAST BULKY 4 IN (GAUZE/BANDAGES/DRESSINGS) ×2 IMPLANT
BIT DRILL 2.5X110 QC LCP DISP (BIT) ×2 IMPLANT
BIT DRILL CAL 3.2 LONG (BIT) ×2 IMPLANT
BIT DRILL QC 3.5X110 (BIT) ×2 IMPLANT
BIT DRILL SHORT 3.2MM (DRILL) ×1 IMPLANT
BLADE SURG 10 STRL SS (BLADE) ×4 IMPLANT
BRUSH SCRUB DISP (MISCELLANEOUS) ×4 IMPLANT
CLOTH BEACON ORANGE TIMEOUT ST (SAFETY) ×2 IMPLANT
COVER SURGICAL LIGHT HANDLE (MISCELLANEOUS) ×4 IMPLANT
DRAPE C-ARM 42X72 X-RAY (DRAPES) ×2 IMPLANT
DRAPE C-ARMOR (DRAPES) ×2 IMPLANT
DRAPE EXTREMITY T 121X128X90 (DRAPE) ×4 IMPLANT
DRAPE INCISE IOBAN 66X45 STRL (DRAPES) IMPLANT
DRAPE U-SHAPE 47X51 STRL (DRAPES) ×2 IMPLANT
DRILL SHORT 3.2MM (DRILL) ×2
DRSG ADAPTIC 3X8 NADH LF (GAUZE/BANDAGES/DRESSINGS) ×2 IMPLANT
DRSG PAD ABDOMINAL 8X10 ST (GAUZE/BANDAGES/DRESSINGS) ×2 IMPLANT
ELECT REM PT RETURN 9FT ADLT (ELECTROSURGICAL) ×2
ELECTRODE REM PT RTRN 9FT ADLT (ELECTROSURGICAL) ×1 IMPLANT
EVACUATOR 1/8 PVC DRAIN (DRAIN) IMPLANT
GLOVE BIO SURGEON STRL SZ7.5 (GLOVE) ×2 IMPLANT
GLOVE BIO SURGEON STRL SZ8 (GLOVE) ×2 IMPLANT
GLOVE BIO SURGEON STRL SZ8.5 (GLOVE) ×2 IMPLANT
GLOVE BIOGEL PI IND STRL 7.5 (GLOVE) ×1 IMPLANT
GLOVE BIOGEL PI IND STRL 8 (GLOVE) ×1 IMPLANT
GLOVE BIOGEL PI INDICATOR 7.5 (GLOVE) ×1
GLOVE BIOGEL PI INDICATOR 8 (GLOVE) ×1
GOWN PREVENTION PLUS XLARGE (GOWN DISPOSABLE) ×2 IMPLANT
GOWN STRL NON-REIN LRG LVL3 (GOWN DISPOSABLE) ×4 IMPLANT
KIT BASIN OR (CUSTOM PROCEDURE TRAY) ×2 IMPLANT
KIT ROOM TURNOVER OR (KITS) ×2 IMPLANT
NAIL TIBIAL CANN 10MM PROX 330 (Nail) ×2 IMPLANT
PACK GENERAL/GYN (CUSTOM PROCEDURE TRAY) ×2 IMPLANT
PAD ARMBOARD 7.5X6 YLW CONV (MISCELLANEOUS) ×4 IMPLANT
REAMER ROD DEEP FLUTE 2.5X950 (INSTRUMENTS) ×2 IMPLANT
SCREW CORTEX TI ST 3.5X45 (Screw) ×2 IMPLANT
SCREW DUAL CORE 5X50 (Screw) ×2 IMPLANT
SCREW DUAL CORE LOCKING 5.0X60 (Screw) ×2 IMPLANT
SCREW LOCK STAR 5X30 (Screw) ×2 IMPLANT
SCREW LOCK STAR 5X32 (Screw) ×2 IMPLANT
SCREW LOCK STAR 5X34 (Screw) ×2 IMPLANT
SCREW LOCK STAR 5X52 (Screw) ×2 IMPLANT
SCREW LOCKING 4.0 34MM (Screw) ×4 IMPLANT
SCREW LOCKING DUAL 5.0 65MM (Screw) ×2 IMPLANT
SPONGE GAUZE 4X4 12PLY (GAUZE/BANDAGES/DRESSINGS) ×2 IMPLANT
STAPLER VISISTAT 35W (STAPLE) ×2 IMPLANT
STRIP CLOSURE SKIN 1/2X4 (GAUZE/BANDAGES/DRESSINGS) ×2 IMPLANT
SUT PROLENE 3 0 PS 2 (SUTURE) IMPLANT
SUT VIC AB 0 CT1 27 (SUTURE)
SUT VIC AB 0 CT1 27XBRD ANBCTR (SUTURE) IMPLANT
SUT VIC AB 2-0 CT1 27 (SUTURE)
SUT VIC AB 2-0 CT1 TAPERPNT 27 (SUTURE) IMPLANT
SUT VIC AB 2-0 CT3 27 (SUTURE) IMPLANT
TOWEL OR 17X24 6PK STRL BLUE (TOWEL DISPOSABLE) ×2 IMPLANT
TOWEL OR 17X26 10 PK STRL BLUE (TOWEL DISPOSABLE) ×4 IMPLANT
TRAY FOLEY CATH 16FRSI W/METER (SET/KITS/TRAYS/PACK) ×2 IMPLANT
YANKAUER SUCT BULB TIP NO VENT (SUCTIONS) IMPLANT

## 2013-05-01 NOTE — Preoperative (Signed)
Beta Blockers   Reason not to administer Beta Blockers:Not Applicable 

## 2013-05-01 NOTE — Brief Op Note (Signed)
04/29/2013  3:04 PM   PATIENT:  [name]  52 y.o. female  PRE-OPERATIVE DIAGNOSIS:  pelvis fracture APC2, right tibia fractures of the plataeu and shaft, possible compartment syndrome  POST-OPERATIVE DIAGNOSIS:  pelvis fracture APC2, right tibia fractures  of the plataeu and shaft, negative compartment syndrome  PROCEDURE:  Procedure(s): 1. OPEN REDUCTION INTERNAL FIXATION (ORIF) of anterior PELVIC ring diastasis (pubic symphysis) 2. EXTERNAL FIXATION LEG (Right) spanning with closed reduction of tibial plateau and shaft 3. Measurement of intracompartmental pressures x 4 right leg  SURGEON:  Surgeon(s) and Role:    * Budd Palmer, MD - Primary  PHYSICIAN ASSISTANT: Montez Morita, [facility]  ANESTHESIA:   general  EBL:  Total I/O In: 1525 [I.V.:1525] Out: 1100 [Urine:1000; Blood:100]  BLOOD ADMINISTERED:none  DRAINS: one JP pelvic ring  LOCAL MEDICATIONS USED:  NONE  SPECIMEN:  No Specimen  DISPOSITION OF SPECIMEN:  N/A  COUNTS:  YES  TOURNIQUET:  * No tourniquets in log *  DICTATION: .Other Dictation: Dictation Number [id number]  PLAN OF CARE: Admit to inpatient   PATIENT DISPOSITION:  PACU - hemodynamically stable.   Delay start of Pharmacological VTE agent (>24hrs) due to surgical blood loss or risk of bleeding: no
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
## 2013-05-01 NOTE — Anesthesia Preprocedure Evaluation (Signed)
Anesthesia Evaluation  Patient identified by MRN, date of birth, ID band Patient awake    Reviewed: Allergy & Precautions, H&P , NPO status , Patient's Chart, lab work & pertinent test results  History of Anesthesia Complications Negative for: history of anesthetic complications  Airway Mallampati: II TM Distance: >3 FB Neck ROM: Full    Dental  (+) Teeth Intact   Pulmonary asthma ,    Pulmonary exam normal       Cardiovascular negative cardio ROS      Neuro/Psych negative neurological ROS  negative psych ROS   GI/Hepatic negative GI ROS, Neg liver ROS,   Endo/Other  negative endocrine ROS  Renal/GU negative Renal ROS     Musculoskeletal   Abdominal   Peds  Hematology   Anesthesia Other Findings   Reproductive/Obstetrics                           Anesthesia Physical Anesthesia Plan  ASA: III  Anesthesia Plan: General   Post-op Pain Management:    Induction: Intravenous  Airway Management Planned: Oral ETT  Additional Equipment:   Intra-op Plan:   Post-operative Plan: Possible Post-op intubation/ventilation  Informed Consent: I have reviewed the patients History and Physical, chart, labs and discussed the procedure including the risks, benefits and alternatives for the proposed anesthesia with the patient or authorized representative who has indicated his/her understanding and acceptance.   Dental advisory given  Plan Discussed with: CRNA, Anesthesiologist and Surgeon  Anesthesia Plan Comments:         Anesthesia Quick Evaluation

## 2013-05-01 NOTE — Brief Op Note (Signed)
04/29/2013 - 05/01/2013  2:46 PM  PATIENT:  [name]  52 y.o. female  PRE-OPERATIVE DIAGNOSIS:  Right Tibial Shaft and bicondylar plateau Fractures, retained external fixator  POST-OPERATIVE DIAGNOSIS:  Right Tibial Shaft and bicondylar plateau Fractures, retained external fixator  PROCEDURE:  Procedure(s): 1. Internal fixation of right tibial bicondylar plateau 2. INTRAMEDULLARY (IM) NAIL RIGHT TIBIAL (Right) with Synthes EX 10mm x statically locked nail 3. Removal of external fixator under anesthesia  SURGEON:  Surgeon(s) and Role:    * Budd Palmer, MD - Primary  PHYSICIAN ASSISTANT: Montez Morita, [facility]  ANESTHESIA:   general  EBL:  Total I/O In: 1525 [I.V.:1525] Out: 1100 [Urine:1000; Blood:100]  BLOOD ADMINISTERED:none  DRAINS: none   LOCAL MEDICATIONS USED:  NONE  SPECIMEN:  No Specimen  DISPOSITION OF SPECIMEN:  N/A  COUNTS:  YES  TOURNIQUET:  * No tourniquets in log *  DICTATION: Dictation# [id number]  PLAN OF CARE: Admit to inpatient   PATIENT DISPOSITION:  PACU - hemodynamically stable.   Delay start of Pharmacological VTE agent (>24hrs) due to surgical blood loss or risk of bleeding: no
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
## 2013-05-01 NOTE — Transfer of Care (Signed)
Immediate Anesthesia Transfer of Care Note  Patient: [name]  Procedure(s) Performed: Procedure(s): INTRAMEDULLARY (IM) NAIL RIGHT TIBIAL (Right)  Patient Location: PACU  Anesthesia Type:General  Level of Consciousness: awake  Airway & Oxygen Therapy: Patient Spontanous Breathing and Patient connected to nasal cannula oxygen  Post-op Assessment: Report given to PACU RN and Post -op Vital signs reviewed and stable  Post vital signs: Reviewed and stable  Complications: No apparent anesthesia complications

## 2013-05-01 NOTE — Progress Notes (Signed)
Orthopedic Tech Progress Note Patient Details:  [name] [birth year] [id number]  Ortho Devices Ortho Device/Splint Location: put overehead frame on bed   Jennye Moccasin 05/01/2013, 6:27 PM

## 2013-05-01 NOTE — Anesthesia Postprocedure Evaluation (Signed)
Anesthesia Post Note  Patient: [name]  Procedure(s) Performed: Procedure(s) (LRB): INTRAMEDULLARY (IM) NAIL RIGHT TIBIAL (Right)  Anesthesia type: general  Patient location: PACU  Post pain: Pain level controlled  Post assessment: Patient's Cardiovascular Status Stable  Last Vitals:  Filed Vitals:   05/01/13 1230  BP: 120/50  Pulse: 77  Temp:   Resp: 10    Post vital signs: Reviewed and stable  Level of consciousness: sedated  Complications: No apparent anesthesia complications

## 2013-05-01 NOTE — Progress Notes (Signed)
PT Cancellation   05/01/13 1100  PT Visit Information  Last PT Received On 05/01/13  Reason Eval/Treat Not Completed Patient not medically ready (Pt currently in OR. )    Jake Shark, PT DPT [phone]

## 2013-05-02 LAB — BASIC METABOLIC PANEL WITH GFR
BUN: 4 mg/dL — ABNORMAL LOW (ref 6–23)
CO2: 29 meq/L (ref 19–32)
Calcium: 8.2 mg/dL — ABNORMAL LOW (ref 8.4–10.5)
Chloride: 101 meq/L (ref 96–112)
Creatinine, Ser: 0.55 mg/dL (ref 0.50–1.10)
GFR calc Af Amer: >90 mL/min
GFR calc non Af Amer: >90 mL/min
Glucose, Bld: 150 mg/dL — ABNORMAL HIGH (ref 70–99)
Potassium: 3.8 meq/L (ref 3.5–5.1)
Sodium: 134 meq/L — ABNORMAL LOW (ref 135–145)

## 2013-05-02 LAB — CBC
HCT: 21.7 % — ABNORMAL LOW (ref 36.0–46.0)
Hemoglobin: 7.4 g/dL — ABNORMAL LOW (ref 12.0–15.0)
MCH: 30 pg (ref 26.0–34.0)
MCHC: 34.1 g/dL (ref 30.0–36.0)
MCV: 87.9 fL (ref 78.0–100.0)
Platelets: 117 K/uL — ABNORMAL LOW (ref 150–400)
RBC: 2.47 MIL/uL — ABNORMAL LOW (ref 3.87–5.11)
RDW: 13.4 % (ref 11.5–15.5)
WBC: 10.6 K/uL — ABNORMAL HIGH (ref 4.0–10.5)

## 2013-05-02 MED ORDER — OXYCODONE HCL 5 MG PO TABS
5.0000 mg | ORAL_TABLET | ORAL | Status: DC | PRN
  Administered 2013-05-03: 5 mg via ORAL
  Administered 2013-05-03: 10 mg via ORAL
  Administered 2013-05-03: 5 mg via ORAL
  Administered 2013-05-04 (×2): 10 mg via ORAL
  Administered 2013-05-04 (×2): 5 mg via ORAL
  Administered 2013-05-05: 10 mg via ORAL
  Administered 2013-05-05: 5 mg via ORAL
  Filled 2013-05-02: qty 1
  Filled 2013-05-02 (×2): qty 2
  Filled 2013-05-02: qty 1
  Filled 2013-05-02: qty 2
  Filled 2013-05-02 (×2): qty 1
  Filled 2013-05-02 (×2): qty 2
  Filled 2013-05-02: qty 1

## 2013-05-02 MED ORDER — METHOCARBAMOL 100 MG/ML IJ SOLN
500.0000 mg | Freq: Four times a day (QID) | INTRAVENOUS | Status: DC | PRN
  Filled 2013-05-02: qty 10

## 2013-05-02 MED ORDER — PANTOPRAZOLE SODIUM 40 MG PO PACK
40.0000 mg | PACK | Freq: Every day | ORAL | Status: DC
  Administered 2013-05-03 – 2013-05-05 (×3): 40 mg via ORAL
  Filled 2013-05-02 (×5): qty 20

## 2013-05-02 MED ORDER — OXYCODONE-ACETAMINOPHEN 5-325 MG PO TABS
1.0000 | ORAL_TABLET | Freq: Four times a day (QID) | ORAL | Status: DC | PRN
  Administered 2013-05-02 – 2013-05-03 (×2): 1 via ORAL
  Administered 2013-05-05 (×3): 2 via ORAL
  Filled 2013-05-02: qty 2
  Filled 2013-05-02 (×2): qty 1
  Filled 2013-05-02 (×2): qty 2

## 2013-05-02 MED ORDER — ACETAMINOPHEN 325 MG PO TABS: 325.0000 mg | ORAL_TABLET | Freq: Four times a day (QID) | ORAL | Status: DC | PRN

## 2013-05-02 MED ORDER — METHOCARBAMOL 500 MG PO TABS
500.0000 mg | ORAL_TABLET | Freq: Four times a day (QID) | ORAL | Status: DC | PRN
  Administered 2013-05-03: 500 mg via ORAL
  Administered 2013-05-03: 1000 mg via ORAL
  Administered 2013-05-04 – 2013-05-05 (×2): 500 mg via ORAL
  Filled 2013-05-02 (×3): qty 2
  Filled 2013-05-02 (×3): qty 1
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
## 2013-05-02 NOTE — Clinical Social Work Note (Signed)
Clinical Social Worker continuing to follow patient for support and discharge planning needs.  PT/OT planning to work with patient today to determine patient best avenue at discharge.  Patient family continues to remain at bedside confirming they will assist patient at discharge.  CSW remains available for support and to facilitate patient discharge needs if appropriate once medically ready.  Macario Golds, [place] [phone]

## 2013-05-02 NOTE — Progress Notes (Signed)
Report given to [facility] RN on [facility], to transfer via bed to [facility], Berle Mull RN
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
## 2013-05-02 NOTE — Progress Notes (Signed)
Rehab Admissions Coordinator Note:  Patient was screened by Clois Dupes for appropriateness for an Inpatient Acute Rehab Consult.  At this time, we are recommending Inpatient Rehab consult.  Clois Dupes 05/02/2013, 5:25 PM  I can be reached at [phone].

## 2013-05-02 NOTE — Progress Notes (Signed)
Pt transported via bed to [facility] -05 with family and belongings, pain level currently a 0, on RA, sats well, Berle Mull RN
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
## 2013-05-02 NOTE — ED Notes (Signed)
Trauma Start time per trauma sheet.
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
## 2013-05-02 NOTE — Progress Notes (Signed)
Trauma Service Note  Subjective: Patient is awake and alert and oriented.  Says her throat hurts when she swallows.  Wants to ge back to full liquids  Objective: Vital signs in last 24 hours: Temp:  [98.2 F (36.8 C)-101.8 F (38.8 C)] 98.6 F (37 C) (10/24 0752) Pulse Rate:  [77-112] 78 (10/24 0800) Resp:  [10-23] 12 (10/24 0800) BP: (102-146)/(37-92) 114/62 mmHg (10/24 0800) SpO2:  [97 %-100 %] 100 % (10/24 0800) Last BM Date: 04/29/13  Intake/Output from previous day: 10/23 0701 - 10/24 0700 In: 3818.6 [I.V.:3668.6; IV Piggyback:150] Out: 2665 [Urine:2565; Blood:100] Intake/Output this shift: Total I/O In: 125 [I.V.:125] Out: 275 [Urine:275]  General: No acute distress.  On PCA with dilaudid  Lungs: Clear to auscultation  Abd: Soft, benign  Extremities: RLE in long leg splint.  Good capillary refill of the toes, no numbness  Neuro: Intact  Lab Results: CBC   Recent Labs  05/01/13 0500 05/02/13 0550  WBC 12.0* 10.6*  HGB 9.7* 7.4*  HCT 28.2* 21.7*  PLT 122* 117*   BMET  Recent Labs  05/01/13 0500 05/02/13 0550  NA 133* 134*  K 4.0 3.8  CL 100 101  CO2 25 29  GLUCOSE 142* 150*  BUN 5* 4*  CREATININE 0.54 0.55  CALCIUM 8.5 8.2*   PT/INR No results found for this basename: LABPROT, INR,  in the last 72 hours ABG No results found for this basename: PHART, PCO2, PO2, HCO3,  in the last 72 hours  Studies/Results: Dg Tibia/fibula Right Port  05/01/2013   ADDENDUM REPORT: 05/01/2013 12:24  ADDENDUM: Following the is C-arm fluoroscopy and spot views, portable two view tib-fib study is also performed, confirming presence of a intra medullary tibial nail, secured proximal and distal cortical screws. Alignment is near anatomic across the comminuted proximal tibial fracture. Fibular fracture is again noted.  Impression: ORIF of right tibial fracture.   Electronically Signed   By: Rosalie Gums M.D.   On: 05/01/2013 12:24   05/01/2013   CLINICAL DATA:  ORIF  right tibial shaft fracture. Tibial nail.  EXAM: DG C-ARM 61-120 MIN; PORTABLE RIGHT TIBIA AND FIBULA - 2 VIEW  COMPARISON:  04/29/2013  FLUOROSCOPY TIME:  1 min, 34 seconds  FINDINGS: Images are performed at various stages of ORIF of the tibia. Tibial nail traverses the length of the tibia and is transfixed with proximal and distal screws. Alignment is near anatomic. Again noted is fibular fracture.  IMPRESSION: ORIF of the tibia.  Electronically Signed: By: Rosalie Gums M.D. On: 05/01/2013 11:35   Dg C-arm 61-120 Min  05/01/2013   ADDENDUM REPORT: 05/01/2013 12:24  ADDENDUM: Following the is C-arm fluoroscopy and spot views, portable two view tib-fib study is also performed, confirming presence of a intra medullary tibial nail, secured proximal and distal cortical screws. Alignment is near anatomic across the comminuted proximal tibial fracture. Fibular fracture is again noted.  Impression: ORIF of right tibial fracture.   Electronically Signed   By: Rosalie Gums M.D.   On: 05/01/2013 12:24   05/01/2013   CLINICAL DATA:  ORIF right tibial shaft fracture. Tibial nail.  EXAM: DG C-ARM 61-120 MIN; PORTABLE RIGHT TIBIA AND FIBULA - 2 VIEW  COMPARISON:  04/29/2013  FLUOROSCOPY TIME:  1 min, 34 seconds  FINDINGS: Images are performed at various stages of ORIF of the tibia. Tibial nail traverses the length of the tibia and is transfixed with proximal and distal screws. Alignment is near anatomic. Again noted is fibular  fracture.  IMPRESSION: ORIF of the tibia.  Electronically Signed: By: Rosalie Gums M.D. On: 05/01/2013 11:35    Anti-infectives: Anti-infectives   Start     Dose/Rate Route Frequency Ordered Stop   05/01/13 1400  ceFAZolin (ANCEF) IVPB 1 g/50 mL premix     1 g 100 mL/hr over 30 Minutes Intravenous 3 times per day 05/01/13 1329 05/02/13 0629   05/01/13 0800  [MAR Hold]  ceFAZolin (ANCEF) IVPB 2 g/50 mL premix     (On MAR Hold since 05/01/13 0702)   2 g 100 mL/hr over 30 Minutes Intravenous   Once 04/30/13 0849 05/01/13 0817   04/29/13 1900  ceFAZolin (ANCEF) IVPB 1 g/50 mL premix     1 g 100 mL/hr over 30 Minutes Intravenous Every 6 hours 04/29/13 1857 04/30/13 0700   04/29/13 1330  [MAR Hold]  ceFAZolin (ANCEF) IVPB 2 g/50 mL premix     (On MAR Hold since 04/29/13 1337)   2 g 100 mL/hr over 30 Minutes Intravenous  Once 04/29/13 1222 04/29/13 1355      Assessment/Plan: s/p Procedure(s): INTRAMEDULLARY (IM) NAIL RIGHT TIBIAL d/c foley Transfer to 5N Go back to full liquid diet. Decrease IVFs. Ensure supplementation  LOS: 3 days   Marta Lamas. Gae Bon, MD, FACS [phone] Trauma Surgeon 05/02/2013

## 2013-05-03 LAB — CBC
Hemoglobin: 7.2 g/dL — ABNORMAL LOW (ref 12.0–15.0)
MCV: 89.5 fL (ref 78.0–100.0)
Platelets: 158 10*3/uL (ref 150–400)
RBC: 2.37 MIL/uL — ABNORMAL LOW (ref 3.87–5.11)
WBC: 9.7 10*3/uL (ref 4.0–10.5)

## 2013-05-03 MED ORDER — ENSURE PUDDING PO PUDG
1.0000 | Freq: Three times a day (TID) | ORAL | Status: DC
  Administered 2013-05-03: 1 via ORAL

## 2013-05-03 NOTE — Progress Notes (Signed)
2 Days Post-Op  Subjective: Tired from pt, otherwise ok  Objective: Vital signs in last 24 hours: Temp:  [98.8 F (37.1 C)-99.3 F (37.4 C)] 99.3 F (37.4 C) (10/25 0501) Pulse Rate:  [91-102] 91 (10/25 0501) Resp:  [13-17] 13 (10/25 1148) BP: (87-116)/(47-59) 113/50 mmHg (10/25 0501) SpO2:  [98 %-100 %] 98 % (10/25 1148) Last BM Date: 04/29/13  Intake/Output from previous day: 10/24 0701 - 10/25 0700 In: 1908 [P.O.:600; I.V.:1308] Out: 2250 [Urine:2250] Intake/Output this shift:    General appearance: no distress Resp: clear to auscultation bilaterally Cardio: regular rate and rhythm GI: soft nontender Extremities: distally nvi  Lab Results:   Recent Labs  05/02/13 0550 05/03/13 0837  WBC 10.6* 9.7  HGB 7.4* 7.2*  HCT 21.7* 21.2*  PLT 117* 158   BMET  Recent Labs  05/01/13 0500 05/02/13 0550  NA 133* 134*  K 4.0 3.8  CL 100 101  CO2 25 29  GLUCOSE 142* 150*  BUN 5* 4*  CREATININE 0.54 0.55  CALCIUM 8.5 8.2*   PT/INR No results found for this basename: LABPROT, INR,  in the last 72 hours ABG No results found for this basename: PHART, PCO2, PO2, HCO3,  in the last 72 hours  Studies/Results: No results found.  Anti-infectives: Anti-infectives   Start     Dose/Rate Route Frequency Ordered Stop   05/01/13 1400  ceFAZolin (ANCEF) IVPB 1 g/50 mL premix     1 g 100 mL/hr over 30 Minutes Intravenous 3 times per day 05/01/13 1329 05/02/13 0629   05/01/13 0800  [MAR Hold]  ceFAZolin (ANCEF) IVPB 2 g/50 mL premix     (On MAR Hold since 05/01/13 0702)   2 g 100 mL/hr over 30 Minutes Intravenous  Once 04/30/13 0849 05/01/13 0817   04/29/13 1900  ceFAZolin (ANCEF) IVPB 1 g/50 mL premix     1 g 100 mL/hr over 30 Minutes Intravenous Every 6 hours 04/29/13 1857 04/30/13 0700   04/29/13 1330  [MAR Hold]  ceFAZolin (ANCEF) IVPB 2 g/50 mL premix     (On MAR Hold since 04/29/13 1337)   2 g 100 mL/hr over 30 Minutes Intravenous  Once 04/29/13 1222 04/29/13 1355       Assessment/Plan: S/p AvP 1. Po pain meds with iv backup today 2. pulm toilet 3. Regular diet with ensure 4. lovenox for proph 5. Cont pt 6. Will need rehab consideration it appears   [facility] 05/03/2013
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
## 2013-05-04 LAB — CBC
HCT: 20.7 % — ABNORMAL LOW (ref 36.0–46.0)
Hemoglobin: 7.1 g/dL — ABNORMAL LOW (ref 12.0–15.0)
MCHC: 34.3 g/dL (ref 30.0–36.0)
MCV: 88.1 fL (ref 78.0–100.0)
RDW: 13.5 % (ref 11.5–15.5)
WBC: 9.5 10*3/uL (ref 4.0–10.5)
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
## 2013-05-05 ENCOUNTER — Inpatient Hospital Stay (HOSPITAL_COMMUNITY)
Admission: RE | Admit: 2013-05-05 | Discharge: 2013-05-12 | DRG: 945 | Disposition: A | Discharge status: Home / Self Care | Payer: No Typology Code available for payment source | Source: Intra-hospital | Attending: Physical Medicine & Rehabilitation | Admitting: Physical Medicine & Rehabilitation

## 2013-05-05 ENCOUNTER — Encounter (HOSPITAL_COMMUNITY): Payer: Self-pay | Admitting: Orthopedic Surgery

## 2013-05-05 DIAGNOSIS — S02401A Maxillary fracture, unspecified, initial encounter for closed fracture: Secondary | ICD-10-CM | POA: Diagnosis present

## 2013-05-05 DIAGNOSIS — E871 Hypo-osmolality and hyponatremia: Secondary | ICD-10-CM | POA: Diagnosis present

## 2013-05-05 DIAGNOSIS — S329XXA Fracture of unspecified parts of lumbosacral spine and pelvis, initial encounter for closed fracture: Secondary | ICD-10-CM | POA: Diagnosis present

## 2013-05-05 DIAGNOSIS — S82209A Unspecified fracture of shaft of unspecified tibia, initial encounter for closed fracture: Secondary | ICD-10-CM | POA: Diagnosis present

## 2013-05-05 DIAGNOSIS — S02400A Malar fracture unspecified, initial encounter for closed fracture: Secondary | ICD-10-CM | POA: Diagnosis present

## 2013-05-05 DIAGNOSIS — S2249XA Multiple fractures of ribs, unspecified side, initial encounter for closed fracture: Secondary | ICD-10-CM | POA: Diagnosis present

## 2013-05-05 DIAGNOSIS — S060X9A Concussion with loss of consciousness of unspecified duration, initial encounter: Secondary | ICD-10-CM | POA: Diagnosis present

## 2013-05-05 DIAGNOSIS — S060X1A Concussion with loss of consciousness of 30 minutes or less, initial encounter: Secondary | ICD-10-CM

## 2013-05-05 DIAGNOSIS — S82109A Unspecified fracture of upper end of unspecified tibia, initial encounter for closed fracture: Secondary | ICD-10-CM

## 2013-05-05 DIAGNOSIS — Z5189 Encounter for other specified aftercare: Secondary | ICD-10-CM | POA: Diagnosis present

## 2013-05-05 DIAGNOSIS — IMO0002 Reserved for concepts with insufficient information to code with codable children: Secondary | ICD-10-CM

## 2013-05-05 DIAGNOSIS — S82201A Unspecified fracture of shaft of right tibia, initial encounter for closed fracture: Secondary | ICD-10-CM

## 2013-05-05 DIAGNOSIS — S2239XA Fracture of one rib, unspecified side, initial encounter for closed fracture: Secondary | ICD-10-CM

## 2013-05-05 DIAGNOSIS — S02609A Fracture of mandible, unspecified, initial encounter for closed fracture: Secondary | ICD-10-CM | POA: Diagnosis present

## 2013-05-05 DIAGNOSIS — S060XAA Concussion with loss of consciousness status unknown, initial encounter: Secondary | ICD-10-CM

## 2013-05-05 DIAGNOSIS — S2241XA Multiple fractures of ribs, right side, initial encounter for closed fracture: Secondary | ICD-10-CM

## 2013-05-05 DIAGNOSIS — S0230XA Fracture of orbital floor, unspecified side, initial encounter for closed fracture: Secondary | ICD-10-CM | POA: Diagnosis present

## 2013-05-05 DIAGNOSIS — D62 Acute posthemorrhagic anemia: Secondary | ICD-10-CM | POA: Diagnosis present

## 2013-05-05 DIAGNOSIS — S0292XA Unspecified fracture of facial bones, initial encounter for closed fracture: Secondary | ICD-10-CM

## 2013-05-05 LAB — HEMOGLOBIN AND HEMATOCRIT, BLOOD
HCT: 21.1 % — ABNORMAL LOW (ref 36.0–46.0)
Hemoglobin: 7.1 g/dL — ABNORMAL LOW (ref 12.0–15.0)

## 2013-05-05 MED ORDER — ALBUTEROL SULFATE HFA 108 (90 BASE) MCG/ACT IN AERS
2.0000 | INHALATION_SPRAY | RESPIRATORY_TRACT | Status: DC | PRN
  Filled 2013-05-05: qty 6.7

## 2013-05-05 MED ORDER — METHOCARBAMOL 500 MG PO TABS
500.0000 mg | ORAL_TABLET | Freq: Four times a day (QID) | ORAL | Status: DC | PRN
  Administered 2013-05-08 – 2013-05-09 (×2): 500 mg via ORAL
  Filled 2013-05-05 (×2): qty 1

## 2013-05-05 MED ORDER — OXYCODONE HCL 5 MG PO TABS: 5.0000 mg | ORAL_TABLET | ORAL | Status: DC | PRN

## 2013-05-05 MED ORDER — PROCHLORPERAZINE MALEATE 5 MG PO TABS
5.0000 mg | ORAL_TABLET | Freq: Four times a day (QID) | ORAL | Status: DC | PRN
  Filled 2013-05-05: qty 2

## 2013-05-05 MED ORDER — OXYCODONE HCL 5 MG PO TABS
10.0000 mg | ORAL_TABLET | ORAL | Status: DC | PRN
  Administered 2013-05-06 – 2013-05-07 (×4): 10 mg via ORAL
  Administered 2013-05-08 – 2013-05-11 (×3): 15 mg via ORAL
  Filled 2013-05-05 (×2): qty 2
  Filled 2013-05-05: qty 3
  Filled 2013-05-05 (×3): qty 2
  Filled 2013-05-05 (×2): qty 3
  Filled 2013-05-05: qty 2

## 2013-05-05 MED ORDER — PROCHLORPERAZINE EDISYLATE 5 MG/ML IJ SOLN
5.0000 mg | Freq: Four times a day (QID) | INTRAMUSCULAR | Status: DC | PRN
  Filled 2013-05-05: qty 2

## 2013-05-05 MED ORDER — FLEET ENEMA 7-19 GM/118ML RE ENEM: 1.0000 | ENEMA | Freq: Once | RECTAL | Status: AC | PRN

## 2013-05-05 MED ORDER — POLYSACCHARIDE IRON COMPLEX 150 MG PO CAPS
150.0000 mg | ORAL_CAPSULE | Freq: Every day | ORAL | Status: DC
  Administered 2013-05-05 – 2013-05-12 (×8): 150 mg via ORAL
  Filled 2013-05-05 (×11): qty 1

## 2013-05-05 MED ORDER — OXYCODONE HCL 5 MG PO TABS
10.0000 mg | ORAL_TABLET | Freq: Two times a day (BID) | ORAL | Status: DC
  Administered 2013-05-06 – 2013-05-12 (×13): 10 mg via ORAL
  Filled 2013-05-05 (×13): qty 2

## 2013-05-05 MED ORDER — FERROUS SULFATE 325 (65 FE) MG PO TABS
325.0000 mg | ORAL_TABLET | Freq: Two times a day (BID) | ORAL | Status: DC
  Administered 2013-05-05: 325 mg via ORAL
  Filled 2013-05-05 (×2): qty 1

## 2013-05-05 MED ORDER — PANTOPRAZOLE SODIUM 40 MG PO TBEC
40.0000 mg | DELAYED_RELEASE_TABLET | Freq: Every day | ORAL | Status: DC
  Filled 2013-05-05: qty 1

## 2013-05-05 MED ORDER — SENNA 8.6 MG PO TABS
2.0000 | ORAL_TABLET | Freq: Every day | ORAL | Status: DC
  Administered 2013-05-05 – 2013-05-10 (×5): 17.2 mg via ORAL
  Filled 2013-05-05 (×8): qty 2

## 2013-05-05 MED ORDER — ENOXAPARIN SODIUM 40 MG/0.4ML ~~LOC~~ SOLN
40.0000 mg | SUBCUTANEOUS | Status: DC
  Administered 2013-05-05 – 2013-05-10 (×6): 40 mg via SUBCUTANEOUS
  Filled 2013-05-05 (×7): qty 0.4

## 2013-05-05 MED ORDER — GUAIFENESIN-DM 100-10 MG/5ML PO SYRP: 5.0000 mL | ORAL_SOLUTION | Freq: Four times a day (QID) | ORAL | Status: DC | PRN

## 2013-05-05 MED ORDER — PROCHLORPERAZINE 25 MG RE SUPP
12.5000 mg | Freq: Four times a day (QID) | RECTAL | Status: DC | PRN
  Filled 2013-05-05: qty 1

## 2013-05-05 MED ORDER — CEPHALEXIN 250 MG PO CAPS
250.0000 mg | ORAL_CAPSULE | Freq: Three times a day (TID) | ORAL | Status: DC
  Administered 2013-05-05 – 2013-05-12 (×20): 250 mg via ORAL
  Filled 2013-05-05 (×23): qty 1

## 2013-05-05 MED ORDER — TRAZODONE HCL 50 MG PO TABS: 25.0000 mg | ORAL_TABLET | Freq: Every evening | ORAL | Status: DC | PRN

## 2013-05-05 MED ORDER — TRAMADOL HCL 50 MG PO TABS
50.0000 mg | ORAL_TABLET | Freq: Four times a day (QID) | ORAL | Status: DC | PRN
  Administered 2013-05-07: 50 mg via ORAL
  Filled 2013-05-05: qty 1

## 2013-05-05 MED ORDER — ALUM & MAG HYDROXIDE-SIMETH 200-200-20 MG/5ML PO SUSP: 30.0000 mL | ORAL | Status: DC | PRN

## 2013-05-05 MED ORDER — DIPHENHYDRAMINE HCL 12.5 MG/5ML PO ELIX: 12.5000 mg | ORAL_SOLUTION | Freq: Four times a day (QID) | ORAL | Status: DC | PRN

## 2013-05-05 MED ORDER — ACETAMINOPHEN 325 MG PO TABS: 325.0000 mg | ORAL_TABLET | ORAL | Status: DC | PRN

## 2013-05-05 MED ORDER — BISACODYL 10 MG RE SUPP
10.0000 mg | Freq: Every day | RECTAL | Status: DC | PRN
  Administered 2013-05-09: 10 mg via RECTAL
  Filled 2013-05-05: qty 1

## 2013-05-05 NOTE — Progress Notes (Signed)
Rehab admissions - Evaluated for possible admission.  I met with patient, daughter, friend and spoke with boyfriend by phone.  All in agreement to inpatient rehab.  Family stays with patient.  Bed available and can admit to inpatient rehab today.  Call me for questions.  #[phone]

## 2013-05-05 NOTE — Clinical Social Work Note (Signed)
Clinical Social Worker continuing to follow patient and family for support and discharge planning needs.  PT/OT are recommending inpatient rehab and patient is questioning possible return home with family.  Patient discharge plan is inpatient rehab vs. Home with home health.  Patient family is willing to provide 24 hour support and assistance.  Clinical Social Worker will sign off for now as social work intervention is no longer needed. Please consult [place] again if new need arises.  Macario Golds, [place] [phone]
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
## 2013-05-05 NOTE — Progress Notes (Signed)
Report called to RN at [facility]. Pt transferred to CIR via bed.
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
## 2013-05-05 NOTE — Progress Notes (Signed)
She is working with the [employer] coordinator regarding rehab. I discussed the process with her as well. Patient examined and I agree with the assessment and plan  Violeta Gelinas, MD, MPH, FACS Pager: [phone]  05/05/2013 3:04 PM
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
## 2013-05-06 ENCOUNTER — Inpatient Hospital Stay (HOSPITAL_COMMUNITY): Payer: No Typology Code available for payment source

## 2013-05-06 ENCOUNTER — Inpatient Hospital Stay (HOSPITAL_COMMUNITY): Payer: Medicaid Other | Admitting: Occupational Therapy

## 2013-05-06 DIAGNOSIS — S82109A Unspecified fracture of upper end of unspecified tibia, initial encounter for closed fracture: Secondary | ICD-10-CM

## 2013-05-06 DIAGNOSIS — S060X1A Concussion with loss of consciousness of 30 minutes or less, initial encounter: Secondary | ICD-10-CM

## 2013-05-06 DIAGNOSIS — S329XXA Fracture of unspecified parts of lumbosacral spine and pelvis, initial encounter for closed fracture: Secondary | ICD-10-CM

## 2013-05-06 DIAGNOSIS — S2239XA Fracture of one rib, unspecified side, initial encounter for closed fracture: Secondary | ICD-10-CM

## 2013-05-06 DIAGNOSIS — IMO0002 Reserved for concepts with insufficient information to code with codable children: Secondary | ICD-10-CM

## 2013-05-06 LAB — CBC WITH DIFFERENTIAL/PLATELET
Basophils Relative: 0 % (ref 0–1)
Eosinophils Absolute: 0.4 10*3/uL (ref 0.0–0.7)
Eosinophils Relative: 4 % (ref 0–5)
HCT: 20.1 % — ABNORMAL LOW (ref 36.0–46.0)
Hemoglobin: 7 g/dL — ABNORMAL LOW (ref 12.0–15.0)
Lymphs Abs: 2.4 10*3/uL (ref 0.7–4.0)
MCH: 30.6 pg (ref 26.0–34.0)
MCHC: 34.8 g/dL (ref 30.0–36.0)
MCV: 87.8 fL (ref 78.0–100.0)
Monocytes Absolute: 1.1 10*3/uL — ABNORMAL HIGH (ref 0.1–1.0)
Monocytes Relative: 13 % — ABNORMAL HIGH (ref 3–12)
Neutro Abs: 4.8 10*3/uL (ref 1.7–7.7)
WBC: 8.8 10*3/uL (ref 4.0–10.5)

## 2013-05-06 LAB — COMPREHENSIVE METABOLIC PANEL
ALT: 22 U/L (ref 0–35)
AST: 29 U/L (ref 0–37)
Alkaline Phosphatase: 58 U/L (ref 39–117)
BUN: 10 mg/dL (ref 6–23)
Calcium: 8.9 mg/dL (ref 8.4–10.5)
Chloride: 99 mEq/L (ref 96–112)
GFR calc non Af Amer: >90 mL/min (ref >90)
Glucose, Bld: 104 mg/dL — ABNORMAL HIGH (ref 70–99)
Potassium: 4.1 mEq/L (ref 3.5–5.1)
Sodium: 135 mEq/L (ref 135–145)
Total Protein: 6.4 g/dL (ref 6.0–8.3)

## 2013-05-06 MED ORDER — DIPHENHYDRAMINE HCL 25 MG PO CAPS
25.0000 mg | ORAL_CAPSULE | Freq: Once | ORAL | Status: AC
  Administered 2013-05-06: 25 mg via ORAL

## 2013-05-06 MED ORDER — DIPHENHYDRAMINE HCL 25 MG PO CAPS
25.0000 mg | ORAL_CAPSULE | Freq: Once | ORAL | Status: AC
  Filled 2013-05-06: qty 1

## 2013-05-06 MED ORDER — FUROSEMIDE 10 MG/ML IJ SOLN
20.0000 mg | Freq: Once | INTRAMUSCULAR | Status: AC
  Filled 2013-05-06: qty 2

## 2013-05-06 MED ORDER — ACETAMINOPHEN 325 MG PO TABS: 650.0000 mg | ORAL_TABLET | Freq: Once | ORAL | Status: AC

## 2013-05-06 MED ORDER — PANTOPRAZOLE SODIUM 40 MG PO PACK
40.0000 mg | PACK | Freq: Every day | ORAL | Status: DC
  Administered 2013-05-06 – 2013-05-12 (×7): 40 mg via ORAL
  Filled 2013-05-06 (×9): qty 20

## 2013-05-06 MED ORDER — FUROSEMIDE 10 MG/ML IJ SOLN
20.0000 mg | Freq: Once | INTRAMUSCULAR | Status: AC
  Administered 2013-05-06: 20 mg via INTRAVENOUS
  Filled 2013-05-06: qty 2

## 2013-05-06 MED ORDER — ACETAMINOPHEN 325 MG PO TABS
650.0000 mg | ORAL_TABLET | Freq: Once | ORAL | Status: AC
  Administered 2013-05-06: 650 mg via ORAL
  Filled 2013-05-06: qty 2

## 2013-05-06 NOTE — Discharge Summary (Signed)
Janiene Aarons, MD, MPH, FACS Pager: [phone]  

## 2013-05-06 NOTE — Progress Notes (Signed)
Patient information reviewed and entered into eRehab system by Leylanie Woodmansee, RN, CRRN, PPS Coordinator.  Information including medical coding and functional independence measure will be reviewed and updated through discharge.    
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
## 2013-05-06 NOTE — Progress Notes (Signed)
At begin of shift,it was reported that patient was upset with having to be stuck for an IV site and why she had changed floors to therapy. Sat with patient and oriented patient to the rehab unit and the rehab unit routine. It was also explained the rationale as to why the 2 units of blood was ordered and the MD also spoke with the patient. Patient then consented and verbalized understanding. Patient completed 2 units of blood at 1900 without complication. Patient also has swelling to RLE, ace wrap was applied by therapy, but later complained it was too tight. Ace wrap was loosened but still no relief was reported. Patient then agreed to one ace wrap to be applied after supper. Elevated on pillows while in the bed. Continue plan of care.
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
## 2013-05-07 ENCOUNTER — Inpatient Hospital Stay (HOSPITAL_COMMUNITY): Payer: No Typology Code available for payment source

## 2013-05-07 ENCOUNTER — Inpatient Hospital Stay (HOSPITAL_COMMUNITY): Payer: No Typology Code available for payment source | Admitting: Speech Pathology

## 2013-05-07 ENCOUNTER — Encounter (HOSPITAL_COMMUNITY): Payer: Medicaid Other | Admitting: Occupational Therapy

## 2013-05-07 DIAGNOSIS — Z5189 Encounter for other specified aftercare: Secondary | ICD-10-CM | POA: Diagnosis not present

## 2013-05-07 DIAGNOSIS — S2239XA Fracture of one rib, unspecified side, initial encounter for closed fracture: Secondary | ICD-10-CM

## 2013-05-07 DIAGNOSIS — S060X1A Concussion with loss of consciousness of 30 minutes or less, initial encounter: Secondary | ICD-10-CM

## 2013-05-07 DIAGNOSIS — IMO0002 Reserved for concepts with insufficient information to code with codable children: Secondary | ICD-10-CM

## 2013-05-07 DIAGNOSIS — S329XXA Fracture of unspecified parts of lumbosacral spine and pelvis, initial encounter for closed fracture: Secondary | ICD-10-CM

## 2013-05-07 DIAGNOSIS — S82109A Unspecified fracture of upper end of unspecified tibia, initial encounter for closed fracture: Secondary | ICD-10-CM

## 2013-05-07 LAB — TYPE AND SCREEN
Antibody Screen: NEGATIVE
Unit division: 0
Unit division: 0

## 2013-05-07 LAB — CBC
HCT: 29.9 % — ABNORMAL LOW (ref 36.0–46.0)
Hemoglobin: 10.3 g/dL — ABNORMAL LOW (ref 12.0–15.0)
RBC: 3.36 MIL/uL — ABNORMAL LOW (ref 3.87–5.11)
WBC: 11 10*3/uL — ABNORMAL HIGH (ref 4.0–10.5)
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
## 2013-05-07 NOTE — Progress Notes (Signed)
Occupational Therapy Session Note  Patient Details  Name: [name] MRN: [id number] Date of Birth: 04-18-1961  Today's Date: 05/07/2013 Time: 1100-1200 Time Calculation: 60 minutes.    Pt engaged in 1:1 self-care session with focus on functional transfers, dynamic standing balance, activity tolerance.  Pt self-propelled w/c to ADL apartment with no verbal cues for technique.  Completed stand-pivot transfer from w/c to tub bench with OT assisting anteriorly, required min A.  Assist for lifting RLE into tub required.  Pt very sensitive to water temperature in bath, bathed with supervision/set-up, verbal cues for lateral leans to wash buttocks.  OT assist from behind for return transfer to w/c, no device.  Tyna Jaksch present for education on transfers and willing to assist with tasks as needed.  Further education needed for tub/shower transfers and toilet transfers with Inetta Fermo assisting pt.  Pt c/o of pain, RN in to medicate pt during session.  Pt in w/c with visitors in room at conclusion of session.     Short Term Goals: Week 1:  OT Short Term Goal 1 (Week 1): Pt will complete LB dressing tasks with mod A.  OT Short Term Goal 2 (Week 1): Pt will complete 2/3 toileting tasks with steadying assist.  OT Short Term Goal 3 (Week 1): Pt will require no more than min verbal cues during functional transfers.   Skilled Therapeutic Interventions/Progress Updates:      Therapy Documentation Precautions:  Precautions Precautions: Fall Restrictions Weight Bearing Restrictions: Yes RLE Weight Bearing: Non weight bearing LLE Weight Bearing: Weight bearing as tolerated     See FIM for current functional status  Therapy/Group: Individual Therapy  Kandis Ban 05/07/2013, 1:13 PM
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
## 2013-05-07 NOTE — Progress Notes (Signed)
[facility]patient [facility] Individual Statement of Services  Patient Name:  [name]  Date:  05/07/2013  Welcome to the [facility].  Our goal is to provide you with an individualized program based on your diagnosis and situation, designed to meet your specific needs.  With this comprehensive rehabilitation program, you will be expected to participate in at least 3 hours of rehabilitation therapies Monday-Friday, with modified therapy programming on the weekends.  Your rehabilitation program will include the following services:  Physical Therapy (PT), Occupational Therapy (OT), Speech Therapy (ST), 24 hour per day rehabilitation nursing, Neuropsychology, Case Management (Social Worker), Rehabilitation Medicine, Nutrition Services and Pharmacy Services  Weekly team conferences will be held on Tuesdays to discuss your progress.  Your Social Worker will talk with you frequently to get your input and to update you on team discussions.  Team conferences with you and your family in attendance may also be held.  Expected length of stay: 10 days  Overall anticipated outcome: supervision   Depending on your progress and recovery, your program may change. Your Social Worker will coordinate services and will keep you informed of any changes. Your Social Worker's name and contact numbers are listed  below.  The following services may also be recommended but are not provided by the [facility]:   Driving Evaluations  Home Health Rehabiltiation Services  Outpatient Rehabilitation Services  Vocational Rehabilitation   Arrangements will be made to provide these services after discharge if needed.  Arrangements include referral to agencies that provide these services.  Your insurance has been verified to be:  Medicaid Your primary doctor is:  [facility]  Pertinent information will be shared with your doctor and your insurance  company.  Social Worker:  [place], [place] [phone] or [phone]   Information discussed with and copy given to patient by: Amada Jupiter, 05/07/2013, 3:10 PM

## 2013-05-07 NOTE — Plan of Care (Signed)
Problem: RH BOWEL ELIMINATION Goal: RH STG MANAGE BOWEL WITH ASSISTANCE STG Manage Bowel with mini Assistance.  Outcome: Not Progressing No BM since 10/21 Goal: RH STG MANAGE BOWEL W/MEDICATION W/ASSISTANCE STG Manage Bowel with Medication with Assistance.  Outcome: Not Progressing Scheduled senekot; offered suppository
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
## 2013-05-08 ENCOUNTER — Inpatient Hospital Stay (HOSPITAL_COMMUNITY): Payer: No Typology Code available for payment source | Admitting: Occupational Therapy

## 2013-05-08 ENCOUNTER — Inpatient Hospital Stay (HOSPITAL_COMMUNITY): Payer: No Typology Code available for payment source | Admitting: Physical Therapy

## 2013-05-08 ENCOUNTER — Encounter (HOSPITAL_COMMUNITY): Payer: Self-pay | Admitting: Orthopedic Surgery

## 2013-05-08 MED ORDER — WARFARIN SODIUM 7.5 MG PO TABS
7.5000 mg | ORAL_TABLET | Freq: Once | ORAL | Status: AC
  Administered 2013-05-08: 7.5 mg via ORAL
  Filled 2013-05-08: qty 1

## 2013-05-08 MED ORDER — PATIENT'S GUIDE TO USING COUMADIN BOOK
Freq: Once | Status: AC
  Administered 2013-05-08: 17:00:00
  Filled 2013-05-08: qty 1

## 2013-05-08 MED ORDER — WARFARIN VIDEO: Freq: Once | Status: DC

## 2013-05-08 MED ORDER — WARFARIN - PHARMACIST DOSING INPATIENT
Freq: Every day | Status: DC
  Administered 2013-05-10: 18:00:00
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
## 2013-05-08 NOTE — Progress Notes (Signed)
This note has been reviewed and this clinician agrees with information provided.  
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
## 2013-05-08 NOTE — Plan of Care (Signed)
Problem: RH PAIN MANAGEMENT Goal: RH STG PAIN MANAGED AT OR BELOW PT'S PAIN GOAL Managed patient pain at level below or = 4  Outcome: Not Progressing Patient states her pain maintains at level 6-8

## 2013-05-08 NOTE — Progress Notes (Signed)
Physical Therapy Note  Patient Details  Name: [name] MRN: [id number] Date of Birth: [birth year] Today's Date: 05/08/2013  Time 1: 900-953 53 minutes  1:1 no c/o pain.  Treatment focused on education with pt's caregiver "[name]".  Pt/caregiver educated on and performed with supervision/cuing car transfers, bed <> w/c transfers and bed mobility, w/c negotiation, furniture transfers.  Pt's caregiver will benefit from continued education for w/c set up and parts management.  Pt supervision with all transfers with RW.  Time 2: 1115-1150 35 minutes  1:1 no c/o pain, pt c/o fatigue from previous sessions, agreeable to therex.  Pt/boyfriend educated on HEP for supine/seated therex with handout and PT demonstration.  Pt able to perform 2 x 10 sets of exercises for R LE strengthening with AAROM due to discomfort and mm weakness.  Both report they feel comfortable following written exercise handout at home.   Seiya Silsby 05/08/2013, 9:51 AM
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
## 2013-05-08 NOTE — Progress Notes (Signed)
Occupational Therapy Session Note  Patient Details  Name: [name] MRN: [id number] Date of Birth: 04-24-1961  Today's Date: 05/08/2013  Short Term Goals: Week 1:  OT Short Term Goal 1 (Week 1): Pt will complete LB dressing tasks with mod A.  OT Short Term Goal 2 (Week 1): Pt will complete 2/3 toileting tasks with steadying assist.  OT Short Term Goal 3 (Week 1): Pt will require no more than min verbal cues during functional transfers.   Skilled Therapeutic Interventions/Progress Updates:    First Session: Individual Time: 1000-1100 Time Calculation: 60 minutes  Pt, friend Inetta Fermo, and daughter present for self-care session focused on family education for ADL routine and shower transfer.  Tina assisted with stand-pivot transfer with RW from w/c to tub bench.  Both ladies educated on safe positioning of w/c for transfers, as well as how to remove leg rests and lock brakes.  Pt able to lift both legs in tub with supervision. Pt bathed with supervision/set-up, lateral leans for washing buttocks.  Inetta Fermo and daughter assist pt with dressing tasks, sock-aid and reacher utilized for LB dressing.  Inetta Fermo able to provide safe assistance with transfer, more education needed for daughter to assist with this task.  No complaints of pain.   Second Session: Individual Time: 1300-1330 Time Calculation: 30 minutes  Pt, Inetta Fermo, and daughter [name] present for education on transfer from w/c to toilet, w/c to [facility], and w/c to tub/shower.  Pt completed all transfers with supervision assist of both ladies.  Educated on safe w/c positioning and options of using RW or not during each transfer, as well as positioning of self to assist with pt's balance if needed. Inetta Fermo and [name] verbalized and demonstrated understanding of transfers and are able to assist pt with these transfers in hospital room.   No complaints of pain.   Therapy Documentation Precautions:  Precautions Precautions: Fall Restrictions Weight  Bearing Restrictions: Yes RLE Weight Bearing: Non weight bearing LLE Weight Bearing: Weight bearing as tolerated    Pain: Pain Assessment Pain Assessment: No/denies pain  See FIM for current functional status  Therapy/Group: Individual Therapy  Kandis Ban 05/08/2013, 12:15 PM

## 2013-05-09 ENCOUNTER — Encounter (HOSPITAL_COMMUNITY): Payer: Medicaid Other | Admitting: Occupational Therapy

## 2013-05-09 ENCOUNTER — Inpatient Hospital Stay (HOSPITAL_COMMUNITY): Payer: Medicaid Other | Admitting: Physical Therapy

## 2013-05-09 ENCOUNTER — Inpatient Hospital Stay (HOSPITAL_COMMUNITY): Payer: No Typology Code available for payment source | Admitting: Physical Therapy

## 2013-05-09 ENCOUNTER — Inpatient Hospital Stay (HOSPITAL_COMMUNITY): Payer: Medicaid Other

## 2013-05-09 DIAGNOSIS — IMO0002 Reserved for concepts with insufficient information to code with codable children: Secondary | ICD-10-CM

## 2013-05-09 DIAGNOSIS — S060X1A Concussion with loss of consciousness of 30 minutes or less, initial encounter: Secondary | ICD-10-CM

## 2013-05-09 DIAGNOSIS — S329XXA Fracture of unspecified parts of lumbosacral spine and pelvis, initial encounter for closed fracture: Secondary | ICD-10-CM

## 2013-05-09 DIAGNOSIS — S82109A Unspecified fracture of upper end of unspecified tibia, initial encounter for closed fracture: Secondary | ICD-10-CM

## 2013-05-09 DIAGNOSIS — S2239XA Fracture of one rib, unspecified side, initial encounter for closed fracture: Secondary | ICD-10-CM

## 2013-05-09 LAB — PROTIME-INR
INR: 1.16 (ref 0.00–1.49)
Prothrombin Time: 14.6 seconds (ref 11.6–15.2)

## 2013-05-09 MED ORDER — WARFARIN SODIUM 7.5 MG PO TABS
7.5000 mg | ORAL_TABLET | Freq: Once | ORAL | Status: AC
  Administered 2013-05-09: 7.5 mg via ORAL
  Filled 2013-05-09: qty 1

## 2013-05-09 NOTE — Progress Notes (Addendum)
Physical Therapy Note  Patient Details  Name: [name] MRN: [id number] Date of Birth: 02/27/1961 Today's Date: 05/09/2013  Time: 900-953 53 minutes  1:1 Pt c/o R LE pain at end of session, ice applied.  Pt/caregiver education with pt's daughter [name].  Pt/daughter safely performed bed <> w/c, car and furniture transfers with pt performing SPT with RW at supervision level.  Daughter improved with w/c set up and parts management.  Short distance gait with RW with min A x 7'.  Pt educated on option of using RW for short distances (into bathroom at home) if needed. Review of HEP 2 x 10 LE exercises for R AAROM strengthening.  Pt/family educated on importance of elevation, and AROM to decrease swelling.  All expressed understanding.   Chiquitta Matty 05/09/2013, 9:54 AM
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
## 2013-05-09 NOTE — Progress Notes (Signed)
Occupational Therapy Session Note  Patient Details  Name: [name] MRN: [id number] Date of Birth: Dec 24, 1960  Today's Date: 05/09/2013 Time: 1300-1400 Time Calculation (min): 60 min  Short Term Goals: Week 1:  OT Short Term Goal 1 (Week 1): Pt will complete LB dressing tasks with mod A.  OT Short Term Goal 2 (Week 1): Pt will complete 2/3 toileting tasks with steadying assist.  OT Short Term Goal 3 (Week 1): Pt will require no more than min verbal cues during functional transfers.   Skilled Therapeutic Interventions/Progress Updates: Therapeutic exercises with emphasis on shoulder/arm strengthening to improve functional mobility using RW and transfer skills, and general endurance training using SciFit.  Patient performed exercises seated with good attention but required hand guidance to perform thera-band exercises as instructed and described in literature provided.    Therapy Documentation Precautions:  Precautions Precautions: Fall Restrictions Weight Bearing Restrictions: Yes RLE Weight Bearing: Non weight bearing LLE Weight Bearing: Weight bearing as tolerated  Vital Signs: Therapy Vitals Temp: 97.7 F (36.5 C) Temp src: Oral Pulse Rate: 72 Resp: 18 BP: 106/50 mmHg Patient Position, if appropriate: Sitting Oxygen Therapy SpO2: 100 % O2 Device: None (Room air)  Pain: No pain   Exercises: Cardiovascular Exercises Upper Body Ergometer: 15 minutes, hill profile, level 1.4 General Exercises - Upper Extremity Shoulder Flexion: 20 reps;Seated;Theraband;Both;Strengthening Shoulder Extension: 20 reps;Both;Theraband;Seated;Strengthening Elbow Extension: 20 reps;Both;Theraband;Seated;Strengthening  See FIM for current functional status  Therapy/Group: Individual Therapy  Deon Ivey 05/09/2013, 3:52 PM

## 2013-05-09 NOTE — Progress Notes (Signed)
This note has been reviewed and this clinician agrees with information provided.  

## 2013-05-09 NOTE — Progress Notes (Signed)
Physical Therapy Session Note  Patient Details  Name: [name] MRN: [id number] Date of Birth: [birth year]  Today's Date: 05/09/2013 Time: 1400-1430 Time Calculation (min): 30 min  Short Term Goals: Week 1:  PT Short Term Goal 1 (Week 1): Pt to perform bed mobility in a standard bed w/ supervision PT Short Term Goal 2 (Week 1): Pt to perform transfers bed<>w/c w/ min guard assist PT Short Term Goal 3 (Week 1): Pt to propel w/c 150' w/ supervision  Skilled Therapeutic Interventions/Progress Updates:    Pt received from nsg sitting in w/c with pts' friend Inetta Fermo present. Pt education and review on safety and mobility recommendations provided by PT with pt and friend verbally acknowledging understanding. W/c mobility in and around room and on unit with supervision assit with min A for parts management. Sit to stand x 5 with RW with pt maintaining  NWB of R LE.  Standing L LE mini squats, R LE hip flexion, seated B hip/knee flexion, orange theraband for L HS curls. Pt left in w/c with all needs within reach.  Therapy Documentation Precautions:  Precautions Precautions: Fall Restrictions Weight Bearing Restrictions: Yes RLE Weight Bearing: Non weight bearing LLE Weight Bearing: Weight bearing as tolerated      Pain: No pain reported      :            See FIM for current functional status  Therapy/Group: Individual Therapy  Jackelyn Knife 05/09/2013, 4:07 PM
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
## 2013-05-09 NOTE — Progress Notes (Signed)
Occupational Therapy Session Note  Patient Details  Name: [name] MRN: [id number] Date of Birth: 14-Aug-1960  Today's Date: 05/09/2013 Time: 1100--1155 Time Calculation: 55 minutes  Short Term Goals: Week 1:  OT Short Term Goal 1 (Week 1): Pt will complete LB dressing tasks with mod A.  OT Short Term Goal 2 (Week 1): Pt will complete 2/3 toileting tasks with steadying assist.  OT Short Term Goal 3 (Week 1): Pt will require no more than min verbal cues during functional transfers.   Skilled Therapeutic Interventions/Progress Updates:    Pt engaged in 1:1 self-care session focused on family education with friend Inetta Fermo and daughter [name] for ADL routine/functional transfers.  Pt c/o of fatigue intermittently throughout session, no complaint of pain. Tina completed tub/shower transfer with verbal cues for w/c safety, w/c positioning, positioning of self.  Inetta Fermo assisted appropriately with shower tasks, also assisted with transfer back to w/c with verbal cues for safety and positioning of self.  Daughter assisted with dressing tasks (required step-by-step direction for sequencing when assisting pt.).  Reacher utilized for donning pants, donned both socks with supervision.  Tina assisted pts transfer from w/c to recliner with verbal cues for w/c positioning.  Demonstrated appropriate positioning of self  with no cues.    Therapy Documentation Precautions:  Precautions Precautions: Fall Restrictions Weight Bearing Restrictions: Yes RLE Weight Bearing: Non weight bearing LLE Weight Bearing: Weight bearing as tolerated Pain: Pain Assessment Pain Assessment: No/denies pain  See FIM for current functional status  Therapy/Group: Individual Therapy  Kandis Ban 05/09/2013, 11:48 AM

## 2013-05-09 NOTE — Progress Notes (Signed)
Social Work Patient ID: [name], female   DOB: 1960/11/26, 52 y.o.   MRN: [id number]  Discussion with team and patient following initial team conference when targeted d/c date originally set for 11/7 with supervision goals overall.  After more therapy sessions, team feels d/c target date could be changed to [facility] 11/3.  MD in agreement.  Pt and family in agreement as well.  Arranging follow up services and DME.  Continue to follow.  Latia Mataya, LCSW

## 2013-05-10 ENCOUNTER — Inpatient Hospital Stay (HOSPITAL_COMMUNITY): Payer: Medicaid Other | Admitting: Physical Therapy

## 2013-05-10 ENCOUNTER — Inpatient Hospital Stay (HOSPITAL_COMMUNITY): Payer: No Typology Code available for payment source | Admitting: Physical Therapy

## 2013-05-10 ENCOUNTER — Inpatient Hospital Stay (HOSPITAL_COMMUNITY): Payer: No Typology Code available for payment source | Admitting: Occupational Therapy

## 2013-05-10 ENCOUNTER — Inpatient Hospital Stay (HOSPITAL_COMMUNITY): Payer: No Typology Code available for payment source | Admitting: *Deleted

## 2013-05-10 LAB — PROTIME-INR: INR: 1.39 (ref 0.00–1.49)

## 2013-05-10 MED ORDER — WARFARIN SODIUM 7.5 MG PO TABS
7.5000 mg | ORAL_TABLET | Freq: Once | ORAL | Status: AC
  Administered 2013-05-10: 7.5 mg via ORAL
  Filled 2013-05-10: qty 1

## 2013-05-10 NOTE — Progress Notes (Signed)
Occupational Therapy Note Patient Details  Name: Cynithia Hakimi MRN: [id number] Date of Birth: 1961-04-23 Today's Date: 05/10/2013  Time:  1530-1600  )30 min) Pain:none Individual session  Engaged in functional mobility, sit to stand, wc safety, wc mobility in preparing canned soup.  Pt. Needed 1 verbal cue to lock wc brakes and SBA with standing to pour soup into container.  She got mildy SOB during Activity but able to keep going.  Pt. Said someone would be with her at home all the time, but she enjoyed the physical aspect of the activity.  Left in room in wc with call bell, phone in reach.     Humberto Seals 05/10/2013, 4:06 PM

## 2013-05-10 NOTE — Progress Notes (Signed)
ANTICOAGULATION CONSULT NOTE - Follow Up Consult  Pharmacy Consult for Warfarin Indication: VTE prophylaxis  No Known Allergies  Patient Measurements: Weight: 150 lb 2.1 oz (68.1 kg)  Vital Signs: Temp: 98.3 F (36.8 C) (11/01 0522) Temp src: Oral (11/01 0522) BP: 116/66 mmHg (11/01 0522) Pulse Rate: 72 (11/01 0522)  Labs:  Recent Labs  05/09/13 0500 05/10/13 0545  LABPROT 14.6 16.7*  INR 1.16 1.39    The CrCl is unknown because both a height and weight (above a minimum accepted value) are required for this calculation.   Assessment: 52 y.o. F who continues on warfarin for VTE prophylaxis with a SUBtherapeutic INR this morning thought trending up (INR 1.39 << 1.16, goal of 2-3). No CBC since 10/29 -- improving at that time. No overt s/sx of bleeding noted.   Goal of Therapy:  INR 2-3   Plan:  1. Warfarin 7.5 mg x 1 dose at 1800 2. Will continue to monitor for any signs/symptoms of bleeding and will follow up with PT/INR in the a.m.   Georgina Pillion, PharmD, BCPS Clinical Pharmacist Pager: [phone] 05/10/2013 2:24 PM

## 2013-05-10 NOTE — Progress Notes (Signed)
Occupational Therapy Session Note  Patient Details  Name: [name] MRN: [id number] Date of Birth: [birth year]  Today's Date: 05/10/2013 Time: 9:30-10:30 Time Calcuation (min):  60  Skilled Therapeutic Interventions/Progress Updates: "Family friend" Inetta Fermo present, supportive and attentive during session.   She preferred and initiated complete family/friend training with patient for toileitng and ADL  .  Patient and caregiver worked safely and cohesively well together.  [name] appeared to maintain R LE NWB during ADL, including during toileting and standing at sink for pericleansing and dressing.  Patient able to complete LB dressing but on occasion needed minimal assistance (i.e. To stretch shorts out wide enough to accomodate both feet when she donned them with tredded socks on.  Therapy Documentation Precautions:  Precautions Precautions: Fall Restrictions Weight Bearing Restrictions: Yes RLE Weight Bearing: Non weight bearing LLE Weight Bearing: Weight bearing as tolerated  Pain: denied   See FIM for current functional status  Therapy/Group: Individual Therapy  Bud Face [facility] 05/10/2013, 2:59 PM

## 2013-05-10 NOTE — Progress Notes (Signed)
Physical Therapy Session Note  Patient Details  Name: [name] MRN: [id number] Date of Birth: January 23, 1961  Today's Date: 05/10/2013 Time: 1345-1430 Time Calculation (min): 45 min  Short Term Goals: Week 1:  PT Short Term Goal 1 (Week 1): Pt to perform bed mobility in a standard bed w/ supervision PT Short Term Goal 2 (Week 1): Pt to perform transfers bed<>w/c w/ min guard assist PT Short Term Goal 3 (Week 1): Pt to propel w/c 150' w/ supervision  Skilled Therapeutic Interventions/Progress Updates:    Pt received in wheelchair with friend, daughter and other family present. Review of pt, friend and family education with verbally acknowledging understanding of safety and mobility recommendations. Pt performed wheelchair mobility > 150 feet with supervision assist, transfers stand pivot and SPT with [place] of RW with supervision assist. Gait with RW with maintenance of NWB R LE for 30 feet, up and down 1 step and up and down ramp with use of walker with min A as pt displayed fatigue and required seated rest. Static balance activities while performing functional activities for reaching and bending with maintaining NWB of R LE.   Therapy Documentation Precautions:  Precautions Precautions: Fall Restrictions Weight Bearing Restrictions: Yes RLE Weight Bearing: Non weight bearing LLE Weight Bearing: Weight bearing as tolerated    Pain: no pain/ denies pain      Locomotion : Ambulation Ambulation/Gait Assistance: 5: Supervision                See FIM for current functional status  Therapy/Group: Individual Therapy  Jackelyn Knife 05/10/2013, 3:47 PM

## 2013-05-10 NOTE — Progress Notes (Signed)
Physical Therapy Note  Patient Details  Name: [name] MRN: [id number] Date of Birth: [birth year] Today's Date: 05/10/2013  1100-1155 (55 minutes) individual Pain: no c/o pain Focus of treatment: wc mobility training/ activity tolerance; gait training NWB RT LE; therapeutic exercise focused on bilateral LE strengthening/ AROM Treatment: Pt up in wc upon arrival; wc mobility 120 feet X 2 SBA ; transfers stand/turn RW SBA NWB RT LE with min vcs for hand placement sit to stand; sit to supine on mat min assist RT LE; supine to sit on mat SBA; therapeutic exercises X 20 in supine- ankle pumps ( passive stretch RT heel cords), heel slides AA on right; quad sets, glut sets, SAQs; sit to stand SBA; gait 20 feet X 2 RW SBA NWB RT LE .    Daiwik Buffalo,JIM 05/10/2013, 11:45 AM
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
## 2013-05-11 ENCOUNTER — Inpatient Hospital Stay (HOSPITAL_COMMUNITY): Payer: No Typology Code available for payment source | Admitting: Physical Therapy

## 2013-05-11 ENCOUNTER — Encounter (INDEPENDENT_AMBULATORY_CARE_PROVIDER_SITE_OTHER): Payer: Self-pay

## 2013-05-11 ENCOUNTER — Inpatient Hospital Stay (HOSPITAL_COMMUNITY): Payer: No Typology Code available for payment source | Admitting: Occupational Therapy

## 2013-05-11 LAB — PROTIME-INR
INR: 2.39 — ABNORMAL HIGH (ref 0.00–1.49)
Prothrombin Time: 25.3 seconds — ABNORMAL HIGH (ref 11.6–15.2)

## 2013-05-11 MED ORDER — WARFARIN SODIUM 1 MG PO TABS
1.0000 mg | ORAL_TABLET | Freq: Once | ORAL | Status: AC
  Administered 2013-05-11: 1 mg via ORAL
  Filled 2013-05-11: qty 1

## 2013-05-11 NOTE — Progress Notes (Signed)
ANTICOAGULATION CONSULT NOTE - Follow Up Consult  Pharmacy Consult for Warfarin Indication: VTE prophylaxis  No Known Allergies  Patient Measurements: Weight: 150 lb 2.1 oz (68.1 kg)  Vital Signs: Temp: 98.4 F (36.9 C) (11/02 0500) Temp src: Oral (11/02 0500) BP: 113/73 mmHg (11/02 0500) Pulse Rate: 65 (11/02 0500)  Labs:  Recent Labs  05/09/13 0500 05/10/13 0545 05/11/13 0615  LABPROT 14.6 16.7* 25.3*  INR 1.16 1.39 2.39*    The CrCl is unknown because both a height and weight (above a minimum accepted value) are required for this calculation.   Assessment: 52 y.o. F who continues on warfarin for VTE prophylaxis with a therapeutic INR this morning -- however underwent a large jump from 11/2 (INR 2.39 << 1.39, goal of 2-3). No CBC since 10/29 -- improving at that time. No overt s/sx of bleeding noted. Will give very small dose of warfarin this evening and f/u INR trends on 11/3 to better address appropriate dose. Will d/c lovenox today as INR >2.  Goal of Therapy:  INR 2-3   Plan:  1. Warfarin 1 mg x 1 dose at 1800 2. D/c lovenox since INR >2 3. Will continue to monitor for any signs/symptoms of bleeding and will follow up with PT/INR in the a.m.   Georgina Pillion, PharmD, BCPS Clinical Pharmacist Pager: [phone] 05/11/2013 8:40 AM

## 2013-05-11 NOTE — Progress Notes (Signed)
Physical Therapy Note  Patient Details  Name: [name] MRN: [id number] Date of Birth: [birth year] Today's Date: 05/11/2013  1130-1155 (25 minutes) individual Pain: 7/10 right LE / premedicated Precautions: NWB RT LE , WBAT LT LE Focus of treatment: wc mobility for general activity tolerance; Rt LE AROM/ strengthening exercises Treatment: Pt in bed upon arrival; supine to sit min to close SBA RT LE; transfers SBA with RW NWB RT LE; therapeutic exercises RT LE- passive stretch RT ankle , ankle pumps, heel slides (pt using sheet to self assist), SAQs, quad sets; wc mobility -SBA 150 feet on unit using bilateral UEs.    Gumaro Brightbill,JIM 05/11/2013, 11:57 AM

## 2013-05-11 NOTE — Progress Notes (Signed)
Occupational Therapy Session Note  Patient Details  Name: [name] MRN: [id number] Date of Birth: 22-Jan-1961  Today's Date: 05/11/2013 Time: none     Patient missed 45 minutes OT  ADL session due to c/o of not feeling well with asthma symptoms the last few days but under control this am.  C/o cold symptoms and not feeling well.  Patient stated,"I already caught something, and it is freezing in here and I feel the air coming through the windows, and I am afraid I will get Pneumonia if I take a bath in this cold weather.   I will take one tomorrow if it is warm enough.  Right now I am going to stay in this bed and out of the cold air."  Patient's caregiver friend Inetta Fermo was in the room and offered to help [name] with her bathing and dressing whenever she felt up to it.  This clinician suggested to patient to let staff know when/if she felt well enough to bathe today and to keep her nurse informed of her symptoms of not feeling well.  Therapy Documentation  Precautions:  Precautions Precautions: Fall Restrictions Weight Bearing Restrictions: Yes RLE Weight Bearing: Non weight bearing LLE Weight Bearing: Weight bearing as tolerated General: General Amount of Missed OT Time (min): 60 Minutes (45 minutes sick with asthma & fear of getting sicker because too cold) Pain:denied  See FIM for current functional status  Therapy/Group: Individual Therapy  Bud Face [facility] 05/11/2013, 1:23 PM
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
## 2013-05-11 NOTE — Progress Notes (Signed)
All sutures removed from RLE and lower abdomen per order. All sites closed and without drainage. Pt tearful during procedure, given pain medication and allowed to rest once finished. No complaints at this time, going off floor on grounds pass with friend. Mick Sell, RN

## 2013-05-12 ENCOUNTER — Inpatient Hospital Stay (HOSPITAL_COMMUNITY): Payer: Medicaid Other | Admitting: Occupational Therapy

## 2013-05-12 ENCOUNTER — Inpatient Hospital Stay (HOSPITAL_COMMUNITY): Payer: No Typology Code available for payment source | Admitting: Physical Therapy

## 2013-05-12 ENCOUNTER — Inpatient Hospital Stay (HOSPITAL_COMMUNITY): Payer: Medicaid Other

## 2013-05-12 ENCOUNTER — Encounter (HOSPITAL_COMMUNITY): Payer: Medicaid Other | Admitting: Occupational Therapy

## 2013-05-12 DIAGNOSIS — D62 Acute posthemorrhagic anemia: Secondary | ICD-10-CM | POA: Diagnosis present

## 2013-05-12 DIAGNOSIS — E871 Hypo-osmolality and hyponatremia: Secondary | ICD-10-CM | POA: Diagnosis present

## 2013-05-12 HISTORY — DX: Hypo-osmolality and hyponatremia: E87.1

## 2013-05-12 HISTORY — DX: Acute posthemorrhagic anemia: D62

## 2013-05-12 LAB — CBC
HCT: 29.4 % — ABNORMAL LOW (ref 36.0–46.0)
Hemoglobin: 9.9 g/dL — ABNORMAL LOW (ref 12.0–15.0)
MCHC: 33.7 g/dL (ref 30.0–36.0)
MCV: 89.1 fL (ref 78.0–100.0)
RDW: 13.6 % (ref 11.5–15.5)

## 2013-05-12 MED ORDER — WARFARIN SODIUM 5 MG PO TABS
5.0000 mg | ORAL_TABLET | Freq: Once | ORAL | Status: DC
  Filled 2013-05-12: qty 1

## 2013-05-12 MED ORDER — SENNA 8.6 MG PO TABS: 2.0000 | ORAL_TABLET | Freq: Every day | ORAL | Status: DC

## 2013-05-12 MED ORDER — FAMOTIDINE 20 MG PO TABS: 20.0000 mg | ORAL_TABLET | Freq: Two times a day (BID) | ORAL | Status: DC | PRN

## 2013-05-12 MED ORDER — WARFARIN SODIUM 5 MG PO TABS: 5.0000 mg | ORAL_TABLET | Freq: Every day | ORAL | Status: DC

## 2013-05-12 MED ORDER — CEPHALEXIN 250 MG PO CAPS: 250.0000 mg | ORAL_CAPSULE | Freq: Three times a day (TID) | ORAL | Status: DC

## 2013-05-12 MED ORDER — OXYCODONE HCL 10 MG PO TABS: 5.0000 mg | ORAL_TABLET | Freq: Four times a day (QID) | ORAL | Status: DC | PRN

## 2013-05-12 MED ORDER — CEPHALEXIN 250 MG PO CAPS
250.0000 mg | ORAL_CAPSULE | Freq: Three times a day (TID) | ORAL | Status: DC
  Filled 2013-05-12 (×4): qty 1

## 2013-05-12 MED ORDER — POLYSACCHARIDE IRON COMPLEX 150 MG PO CAPS: 150.0000 mg | ORAL_CAPSULE | Freq: Every day | ORAL | Status: DC
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
## 2013-05-12 NOTE — Progress Notes (Signed)
Occupational Therapy Session Note  Patient Details  Name: [name] MRN: [id number] Date of Birth: June 07, 1961  Today's Date: 05/12/2013  Short Term Goals: Week 1:  OT Short Term Goal 1 (Week 1): Pt will complete LB dressing tasks with mod A.  OT Short Term Goal 2 (Week 1): Pt will complete 2/3 toileting tasks with steadying assist.  OT Short Term Goal 3 (Week 1): Pt will require no more than min verbal cues during functional transfers.   Skilled Therapeutic Interventions/Progress Updates:    First Session: Individual Time: 1100-1200 Time Calculation: 60 minutes  Pt engaged in 1:1 self-care session with emphasis on family education with daughter [name].  Session took place in ADL apartment.  [name] assisted with all aspects of ADL routine.  Questioning verbal cue required for safe set-up of w/c next to tub bench, pt proactive in making decisions regarding her needs during session.  Pt completed stand-pivot transfer with supervision, no device.  Completed remainder of bathing and dressing tasks with daughter's assist.  Daughter provided with verbal cues for safe positioning of self during transfer and for sequencing tasks, as well as allowing pt to complete tasks as independently as possible.   Pt and daughter verbalized and demonstrated understanding of information presented.  No complaints of pain.   Second Session: Individual Time: 1330-1410 Time Calculation: 40 minutes  Pt engaged in 1:1 session focused on activity tolerance, functional mobility w/c level, UE strengthening.  Pt self-propelled w/c throughout various areas on unit and retrieve items needed for d/c this afternoon.  Required two brief rest breaks.  Completed toilet transfer/toileting in room and transferred to bed due to fatigue.  Educated on positioning in bed for RLE to reduce swelling.  Reviewed use of DME upon d/c as pt's equipment was delivered and present in her room. No complaints of pain.  Pt feels ready for d/c.  Tyna Jaksch present for session.    Therapy Documentation Precautions:  Precautions Precautions: Fall Restrictions Weight Bearing Restrictions: Yes RLE Weight Bearing: Non weight bearing LLE Weight Bearing: Weight bearing as tolerated  See FIM for current functional status  Therapy/Group: Individual Therapy  Kandis Ban 05/12/2013, 2:05 PM

## 2013-05-12 NOTE — Progress Notes (Signed)
Physical Therapy Note  Patient Details  Name: [name] MRN: [id number] Date of Birth: [birth year] Today's Date: 05/12/2013  Time: 730-810 40 minutes  1:1 No c/o pain, pt states she just rec'd pain meds.  Pt agreeable to do therapy "quickly because I want to eat breakfast".  Pt performed w/c mobility with mod I in home and controlled environments.  Performed SPT with RW multiple attempts with supervision, set up for w/c parts management.  Short distance gait with RW with close supervision.  Supine therex review of HEP with AAROM 2 x 10.  Pt and caregivers report they feel safe to d/c home at supervision level of care.  Caregivers educated on how to fold w/c to put it into car.  Both express understanding.  Pt missed 20 minutes skilled PT due to wanting to eat breakfast.   [name] 05/12/2013, 8:12 AM
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
## 2013-05-12 NOTE — Progress Notes (Signed)
Social Work  Discharge Note  The overall goal for the admission was met for:   Discharge location: Yes - home with friends and family providing 24/7 assistance  Length of Stay: Yes - 7 days  Discharge activity level: Yes - supervision to mod independent  Home/community participation: Yes  Services provided included: MD, RD, PT, OT, SLP, RN, Pharmacy and SW  Financial Services: Medicaid  Follow-up services arranged: Home Health: RN, PT via [facility], DME: 18x18 [employer] w/c with ELRs, cushion, rolling walker, 3n1 commode and tub bench via [facility] and Patient/Family has no preference for HH/DME agencies  Comments (or additional information):  Patient/Family verbalized understanding of follow-up arrangements: Yes  Individual responsible for coordination of the follow-up plan: patient  Confirmed correct DME delivered: Elyana Grabski 05/12/2013    Rosell Khouri

## 2013-05-12 NOTE — Progress Notes (Signed)
Occupational Therapy Session Note  Patient Details  Name: [name] MRN: [id number] Date of Birth: 1961/02/13  Today's Date: 05/12/2013 Time: 1000-1030 Time Calculation (min): 30 min  Short Term Goals: Week 1:  OT Short Term Goal 1 (Week 1): Pt will complete LB dressing tasks with mod A.  OT Short Term Goal 2 (Week 1): Pt will complete 2/3 toileting tasks with steadying assist.  OT Short Term Goal 3 (Week 1): Pt will require no more than min verbal cues during functional transfers.   Skilled Therapeutic Interventions/Progress Updates:    Pt received sitting in w/c and requesting to engage in ergonometer exercise as she reported needing to work on activity tolerance. Pt agreed to practice tub transfer prior to this activity. Pt propelled self in w/c room>ADL apartment to increase activity tolerance and BUE strength. Completed tub transfer using TTB and RW with supervision. Engaged in ergonometer for 3:30 at level 5 before pt requesting rest break and slightly SOB. Engaged in dynamic standing balance activity and activity tolerance task with 1 break over approx 8 min. Worked on postural control when reaching out of BOS and pt maintaining NWB through RLE without cues. At end of session pt returned to room.   Therapy Documentation Precautions:  Precautions Precautions: Fall Restrictions Weight Bearing Restrictions: Yes RLE Weight Bearing: Non weight bearing LLE Weight Bearing: Weight bearing as tolerated General: General Missed Time Reason: Patient unwilling/refused to participate without medical reason Vital Signs:   Pain: Pt with no c/o pain during therapy session.   See FIM for current functional status  Therapy/Group: Individual Therapy  Daneil Dan 05/12/2013, 10:54 AM
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
## 2013-05-12 NOTE — Progress Notes (Signed)
This note has been reviewed and this clinician agrees with information provided.  

## 2013-05-12 NOTE — Progress Notes (Signed)
Occupational Therapy Discharge Summary  Patient Details  Name: [name] MRN: [id number] Date of Birth: 1961/02/06  Today's Date: 05/12/2013  Patient has met 8 of 8 long term goals due to improved activity tolerance and improved balance,improved pain management.   Patient to discharge at overall Supervision level.  Patient's care partner is independent to provide the necessary physical assistance at discharge.  Pt has demonstrated motivation throughout therapies to complete tasks as independently as possible and is able to direct her caregiver for her basic needs.  Use of adaptive equipment as needed continues to increase pts independence with LB dressing tasks.  Family/friends very involved and willing to participate in pt's care throughout stay and upon d/c.      Recommendation:  Patient will benefit from ongoing skilled OT services in home health setting to continue to advance functional skills in the area of BADL.  Equipment: Tub bench, BSC  Reasons for discharge: treatment goals met  Patient/family agrees with progress made and goals achieved: Yes  OT Discharge    Vision/Perception  Vision - History Baseline Vision: Wears glasses only for reading Patient Visual Report: No change from baseline Vision - Assessment Eye Alignment: Within Functional Limits Vision Assessment: Vision not tested Additional Comments:  (Appears WFL for tasks assessed) Perception Perception: Within Functional Limits Praxis Praxis: Intact  Cognition Overall Cognitive Status: Within Functional Limits for tasks assessed Arousal/Alertness: Awake/alert Orientation Level: Oriented X4 Attention: Selective Selective Attention: Appears intact Memory: Appears intact Awareness: Impaired Awareness Impairment: Anticipatory impairment Problem Solving: Appears intact Problem Solving Impairment: Functional basic Executive Function: Reasoning Reasoning: Impaired Safety/Judgment: Appears  intact Sensation Sensation Light Touch: Appears Intact Stereognosis: Not tested Hot/Cold: Appears Intact Proprioception: Appears Intact Coordination Gross Motor Movements are Fluid and Coordinated: Yes Fine Motor Movements are Fluid and Coordinated: Yes Motor  Motor Motor: Within Functional Limits Mobility  Transfers Transfers: Sit to Stand;Stand to Sit Sit to Stand: 5: Supervision Stand to Sit: 5: Supervision  Trunk/Postural Assessment  Cervical Assessment Cervical Assessment: Within Functional Limits Thoracic Assessment Thoracic Assessment: Within Functional Limits Lumbar Assessment Lumbar Assessment: Within Functional Limits Postural Control Postural Control: Within Functional Limits  Balance Balance Balance Assessed: Yes Static Sitting Balance Static Sitting - Level of Assistance: 6: Modified independent (Device/Increase time) Dynamic Sitting Balance Dynamic Sitting - Level of Assistance: 6: Modified independent (Device/Increase time) Static Standing Balance Static Standing - Balance Support: During functional activity Static Standing - Level of Assistance: 5: Stand by assistance Extremity/Trunk Assessment RUE Assessment RUE Assessment: Within Functional Limits LUE Assessment LUE Assessment: Within Functional Limits  See FIM for current functional status  Kandis Ban 05/12/2013, 1:17 PM

## 2013-05-12 NOTE — Progress Notes (Signed)
Patient discharged to home. Discharge instructions given to patient and family by Marissa Nestle, PA with verbal understanding.

## 2013-05-12 NOTE — Progress Notes (Signed)
Physical Therapy Discharge Summary  Patient Details  Name: [name] MRN: [id number] Date of Birth: August 30, 1960  Today's Date: 05/12/2013  Patient has met 6 of 7 long term goals due to improved activity tolerance, improved balance, increased strength and ability to compensate for deficits.  Patient to discharge at a wheelchair level Modified Independent.   Supervision for transfers.  Patient's care partner is independent to provide the necessary physical assistance at discharge.  Reasons goals not met: pt requires set up assist for transfers so pt is supervision, not mod I  Recommendation:  Patient will benefit from ongoing skilled PT services in home health setting to continue to advance safe functional mobility, address ongoing impairments in strength, ROM, mobility, and minimize fall risk.  Equipment: w/c, RW  Reasons for discharge: treatment goals met and discharge from hospital  Patient/family agrees with progress made and goals achieved: Yes  PT Discharge Cognition Overall Cognitive Status: Within Functional Limits for tasks assessed Arousal/Alertness: Awake/alert Sensation Sensation Light Touch: Appears Intact Proprioception: Appears Intact Coordination Gross Motor Movements are Fluid and Coordinated: Yes Fine Motor Movements are Fluid and Coordinated: Yes Motor  Motor Motor: Within Functional Limits   Trunk/Postural Assessment  Cervical Assessment Cervical Assessment: Within Functional Limits Thoracic Assessment Thoracic Assessment: Within Functional Limits Lumbar Assessment Lumbar Assessment: Within Functional Limits Postural Control Postural Control: Within Functional Limits  Balance Static Sitting Balance Static Sitting - Level of Assistance: 6: Modified independent (Device/Increase time) Dynamic Sitting Balance Dynamic Sitting - Level of Assistance: 6: Modified independent (Device/Increase time) Static Standing Balance Static Standing - Level of  Assistance: 5: Stand by assistance Dynamic Standing Balance Dynamic Standing - Balance Support: During functional activity Dynamic Standing - Level of Assistance: 4: Min assist Extremity Assessment      RLE Strength RLE Overall Strength Comments: hip flex 3-/5, knee flex 3-/5, knee ext 3/5, ankle PF/DF 3-/5 (limited by swelling and pain) LLE Assessment LLE Assessment: Within Functional Limits  See FIM for current functional status  [name] 05/12/2013, 8:07 AM

## 2013-05-15 NOTE — Progress Notes (Signed)
This note has been reviewed and this clinician agrees with information provided.  

## 2013-05-15 NOTE — Evaluation (Signed)
This note has been reviewed and this clinician agrees with information provided.  

## 2013-05-19 ENCOUNTER — Other Ambulatory Visit (HOSPITAL_COMMUNITY): Payer: Self-pay | Admitting: Orthopedic Surgery

## 2013-05-19 ENCOUNTER — Ambulatory Visit (HOSPITAL_COMMUNITY)
Admission: RE | Admit: 2013-05-19 | Discharge: 2013-05-19 | Disposition: A | Discharge status: Home / Self Care | Payer: Medicaid Other | Source: Ambulatory Visit | Attending: Orthopedic Surgery | Admitting: Orthopedic Surgery

## 2013-05-19 DIAGNOSIS — M25561 Pain in right knee: Secondary | ICD-10-CM

## 2013-05-19 DIAGNOSIS — M7989 Other specified soft tissue disorders: Secondary | ICD-10-CM

## 2013-05-19 DIAGNOSIS — M79609 Pain in unspecified limb: Secondary | ICD-10-CM | POA: Insufficient documentation

## 2013-05-19 NOTE — Progress Notes (Signed)
VASCULAR LAB PRELIMINARY  PRELIMINARY  PRELIMINARY  PRELIMINARY  Right lower extremity venous Doppler completed.    Preliminary report:  There was no DVT or SVT noted in the right lower extremity.  Cayce Paschal, RVT 05/19/2013, 12:49 PM

## 2013-06-13 ENCOUNTER — Encounter: Payer: Self-pay | Admitting: Physical Medicine & Rehabilitation

## 2013-06-13 ENCOUNTER — Encounter
Payer: No Typology Code available for payment source | Attending: Physical Medicine & Rehabilitation | Admitting: Physical Medicine & Rehabilitation

## 2013-06-13 VITALS — BP 128/68 | HR 67 | Resp 14 | Ht 62.0 in | Wt 145.0 lb

## 2013-06-13 DIAGNOSIS — M7989 Other specified soft tissue disorders: Secondary | ICD-10-CM | POA: Insufficient documentation

## 2013-06-13 DIAGNOSIS — S329XXA Fracture of unspecified parts of lumbosacral spine and pelvis, initial encounter for closed fracture: Secondary | ICD-10-CM | POA: Diagnosis not present

## 2013-06-13 DIAGNOSIS — S0280XA Fracture of other specified skull and facial bones, unspecified side, initial encounter for closed fracture: Secondary | ICD-10-CM | POA: Insufficient documentation

## 2013-06-13 DIAGNOSIS — S82209A Unspecified fracture of shaft of unspecified tibia, initial encounter for closed fracture: Secondary | ICD-10-CM | POA: Insufficient documentation

## 2013-06-13 DIAGNOSIS — S8290XS Unspecified fracture of unspecified lower leg, sequela: Secondary | ICD-10-CM

## 2013-06-13 DIAGNOSIS — S82201S Unspecified fracture of shaft of right tibia, sequela: Secondary | ICD-10-CM

## 2013-06-13 DIAGNOSIS — S2249XA Multiple fractures of ribs, unspecified side, initial encounter for closed fracture: Secondary | ICD-10-CM | POA: Diagnosis not present

## 2013-06-13 DIAGNOSIS — S2241XS Multiple fractures of ribs, right side, sequela: Secondary | ICD-10-CM

## 2013-06-13 DIAGNOSIS — S060X0A Concussion without loss of consciousness, initial encounter: Secondary | ICD-10-CM

## 2013-06-13 DIAGNOSIS — F4310 Post-traumatic stress disorder, unspecified: Secondary | ICD-10-CM

## 2013-06-13 DIAGNOSIS — S329XXS Fracture of unspecified parts of lumbosacral spine and pelvis, sequela: Secondary | ICD-10-CM

## 2013-06-13 DIAGNOSIS — IMO0002 Reserved for concepts with insufficient information to code with codable children: Secondary | ICD-10-CM

## 2013-06-13 HISTORY — DX: Post-traumatic stress disorder, unspecified: F43.10

## 2013-06-13 MED ORDER — METHOCARBAMOL 500 MG PO TABS: 500.0000 mg | ORAL_TABLET | Freq: Four times a day (QID) | ORAL | Status: DC | PRN

## 2013-06-13 MED ORDER — CITALOPRAM HYDROBROMIDE 10 MG PO TABS: 10.0000 mg | ORAL_TABLET | Freq: Every day | ORAL | Status: DC

## 2013-06-13 MED ORDER — OXYCODONE HCL 10 MG PO TABS: 5.0000 mg | ORAL_TABLET | Freq: Four times a day (QID) | ORAL | Status: DC | PRN

## 2013-06-13 NOTE — Patient Instructions (Signed)
CALL ME WITH ANY PROBLEMS OR QUESTIONS (#[phone]).    HAPPY HOLIDAYS!!!!!   
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
## 2013-07-17 ENCOUNTER — Telehealth: Payer: Self-pay

## 2013-07-17 NOTE — Telephone Encounter (Signed)
Charlene @ [facility] was calling to see if we had received the outstanding order for the patient. She requested the order be signed by Dr. Riley KillSwartz, and dated today's date 07/17/13. If there is any question contact number is [phone] ext [phone].

## 2013-07-23 ENCOUNTER — Encounter
Payer: No Typology Code available for payment source | Attending: Physical Medicine & Rehabilitation | Admitting: Physical Medicine & Rehabilitation

## 2013-07-23 ENCOUNTER — Encounter: Payer: Self-pay | Admitting: Physical Medicine & Rehabilitation

## 2013-07-23 VITALS — BP 143/71 | HR 75 | Resp 14 | Ht 62.0 in | Wt 137.0 lb

## 2013-07-23 DIAGNOSIS — IMO0002 Reserved for concepts with insufficient information to code with codable children: Secondary | ICD-10-CM | POA: Diagnosis not present

## 2013-07-23 DIAGNOSIS — S329XXA Fracture of unspecified parts of lumbosacral spine and pelvis, initial encounter for closed fracture: Secondary | ICD-10-CM | POA: Insufficient documentation

## 2013-07-23 DIAGNOSIS — S82201A Unspecified fracture of shaft of right tibia, initial encounter for closed fracture: Secondary | ICD-10-CM

## 2013-07-23 DIAGNOSIS — S82209A Unspecified fracture of shaft of unspecified tibia, initial encounter for closed fracture: Secondary | ICD-10-CM | POA: Diagnosis present

## 2013-07-23 DIAGNOSIS — S0280XA Fracture of other specified skull and facial bones, unspecified side, initial encounter for closed fracture: Secondary | ICD-10-CM | POA: Insufficient documentation

## 2013-07-23 DIAGNOSIS — F4310 Post-traumatic stress disorder, unspecified: Secondary | ICD-10-CM

## 2013-07-23 DIAGNOSIS — S0292XA Unspecified fracture of facial bones, initial encounter for closed fracture: Secondary | ICD-10-CM
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
## 2013-07-23 NOTE — Patient Instructions (Signed)
PLEASE CALL ME WITH ANY PROBLEMS OR QUESTIONS (#[phone]).      

## 2013-08-06 ENCOUNTER — Telehealth: Payer: Self-pay

## 2013-08-06 DIAGNOSIS — S82209A Unspecified fracture of shaft of unspecified tibia, initial encounter for closed fracture: Secondary | ICD-10-CM

## 2013-08-06 DIAGNOSIS — F4310 Post-traumatic stress disorder, unspecified: Secondary | ICD-10-CM

## 2013-08-06 DIAGNOSIS — S329XXA Fracture of unspecified parts of lumbosacral spine and pelvis, initial encounter for closed fracture: Secondary | ICD-10-CM

## 2013-08-06 DIAGNOSIS — S0280XA Fracture of other specified skull and facial bones, unspecified side, initial encounter for closed fracture: Secondary | ICD-10-CM

## 2013-08-06 NOTE — Telephone Encounter (Signed)
Order for physical therapy placed.

## 2013-08-06 NOTE — Telephone Encounter (Signed)
That would be fine 

## 2013-08-06 NOTE — Telephone Encounter (Signed)
Heather a physical therapist with [facility] called to release patient from care and request out patient physical therapy at [facility].

## 2013-08-12 ENCOUNTER — Telehealth: Payer: Self-pay | Admitting: Physical Medicine & Rehabilitation

## 2013-08-12 NOTE — Telephone Encounter (Signed)
OK. Not sure there are other therapy options at this point unfortunately.

## 2013-08-12 NOTE — Telephone Encounter (Signed)
Spoke with [name] ApleyVanda at [facility] and she stated that Medicaid will not pay for outpatient therapy since her surgery was back in October.  They told me she could be a self pay and I explained that to the patient.  Patient can't afford self pay amounts.

## 2013-08-13 DIAGNOSIS — IMO0001 Reserved for inherently not codable concepts without codable children: Secondary | ICD-10-CM

## 2013-08-13 DIAGNOSIS — J45909 Unspecified asthma, uncomplicated: Secondary | ICD-10-CM

## 2013-08-13 DIAGNOSIS — Z4889 Encounter for other specified surgical aftercare: Secondary | ICD-10-CM

## 2013-08-13 DIAGNOSIS — R9389 Abnormal findings on diagnostic imaging of other specified body structures: Secondary | ICD-10-CM

## 2013-08-21 ENCOUNTER — Telehealth: Payer: Self-pay

## 2013-08-21 NOTE — Telephone Encounter (Signed)
[name] with [facility] called to follow up on outpatient rehab [place] request.  Order was faxed on 08/06/2013.  Left message advising [name] the order has been [facility].

## 2013-08-27 ENCOUNTER — Ambulatory Visit: Payer: No Typology Code available for payment source | Attending: Orthopedic Surgery | Admitting: Rehabilitation

## 2013-08-27 DIAGNOSIS — R262 Difficulty in walking, not elsewhere classified: Secondary | ICD-10-CM | POA: Insufficient documentation

## 2013-08-27 DIAGNOSIS — M25569 Pain in unspecified knee: Secondary | ICD-10-CM | POA: Insufficient documentation

## 2013-08-27 DIAGNOSIS — IMO0001 Reserved for inherently not codable concepts without codable children: Secondary | ICD-10-CM | POA: Insufficient documentation

## 2013-08-27 DIAGNOSIS — M25559 Pain in unspecified hip: Secondary | ICD-10-CM | POA: Diagnosis not present

## 2013-08-29 ENCOUNTER — Ambulatory Visit: Payer: No Typology Code available for payment source | Admitting: Rehabilitation

## 2013-09-01 ENCOUNTER — Ambulatory Visit: Payer: No Typology Code available for payment source | Admitting: Rehabilitation

## 2013-09-01 DIAGNOSIS — IMO0001 Reserved for inherently not codable concepts without codable children: Secondary | ICD-10-CM | POA: Diagnosis not present

## 2013-09-03 ENCOUNTER — Ambulatory Visit: Payer: No Typology Code available for payment source | Admitting: Rehabilitation

## 2013-09-03 DIAGNOSIS — IMO0001 Reserved for inherently not codable concepts without codable children: Secondary | ICD-10-CM | POA: Diagnosis not present

## 2013-09-04 ENCOUNTER — Ambulatory Visit: Payer: No Typology Code available for payment source | Admitting: Rehabilitation

## 2013-09-08 ENCOUNTER — Ambulatory Visit: Payer: No Typology Code available for payment source | Attending: Orthopedic Surgery | Admitting: Rehabilitation

## 2013-09-08 DIAGNOSIS — IMO0001 Reserved for inherently not codable concepts without codable children: Secondary | ICD-10-CM | POA: Insufficient documentation

## 2013-09-08 DIAGNOSIS — R262 Difficulty in walking, not elsewhere classified: Secondary | ICD-10-CM | POA: Insufficient documentation

## 2013-09-08 DIAGNOSIS — M25569 Pain in unspecified knee: Secondary | ICD-10-CM | POA: Insufficient documentation

## 2013-09-08 DIAGNOSIS — M25559 Pain in unspecified hip: Secondary | ICD-10-CM | POA: Insufficient documentation

## 2013-09-09 ENCOUNTER — Ambulatory Visit: Payer: No Typology Code available for payment source | Admitting: Rehabilitation

## 2013-09-10 ENCOUNTER — Ambulatory Visit: Payer: No Typology Code available for payment source | Admitting: Rehabilitation

## 2013-09-10 DIAGNOSIS — M25559 Pain in unspecified hip: Secondary | ICD-10-CM | POA: Diagnosis not present

## 2013-09-10 DIAGNOSIS — M25569 Pain in unspecified knee: Secondary | ICD-10-CM | POA: Diagnosis not present

## 2013-09-10 DIAGNOSIS — R262 Difficulty in walking, not elsewhere classified: Secondary | ICD-10-CM | POA: Diagnosis not present

## 2013-09-10 DIAGNOSIS — IMO0001 Reserved for inherently not codable concepts without codable children: Secondary | ICD-10-CM | POA: Diagnosis present

## 2013-09-15 ENCOUNTER — Ambulatory Visit: Payer: No Typology Code available for payment source | Admitting: Rehabilitation

## 2013-09-15 DIAGNOSIS — IMO0001 Reserved for inherently not codable concepts without codable children: Secondary | ICD-10-CM | POA: Diagnosis not present

## 2013-09-17 ENCOUNTER — Ambulatory Visit: Payer: No Typology Code available for payment source | Admitting: Rehabilitation

## 2013-09-17 DIAGNOSIS — IMO0001 Reserved for inherently not codable concepts without codable children: Secondary | ICD-10-CM | POA: Diagnosis not present

## 2013-09-18 ENCOUNTER — Ambulatory Visit: Payer: No Typology Code available for payment source | Admitting: Rehabilitation

## 2013-09-18 ENCOUNTER — Other Ambulatory Visit: Payer: Self-pay | Admitting: Orthopedic Surgery

## 2013-09-18 DIAGNOSIS — IMO0002 Reserved for concepts with insufficient information to code with codable children: Secondary | ICD-10-CM

## 2013-09-22 ENCOUNTER — Ambulatory Visit: Payer: No Typology Code available for payment source | Admitting: Rehabilitation

## 2013-09-24 ENCOUNTER — Inpatient Hospital Stay
Admission: RE | Admit: 2013-09-24 | Discharge: 2013-09-24 | Disposition: A | Discharge status: Home / Self Care | Payer: Medicaid Other | Source: Ambulatory Visit | Attending: Orthopedic Surgery | Admitting: Orthopedic Surgery

## 2013-09-24 ENCOUNTER — Ambulatory Visit: Payer: No Typology Code available for payment source | Admitting: Rehabilitation

## 2013-09-24 DIAGNOSIS — IMO0001 Reserved for inherently not codable concepts without codable children: Secondary | ICD-10-CM | POA: Diagnosis not present

## 2013-09-25 ENCOUNTER — Ambulatory Visit: Payer: No Typology Code available for payment source | Admitting: Rehabilitation

## 2013-09-25 DIAGNOSIS — IMO0001 Reserved for inherently not codable concepts without codable children: Secondary | ICD-10-CM | POA: Diagnosis not present

## 2013-09-30 ENCOUNTER — Ambulatory Visit
Admission: RE | Admit: 2013-09-30 | Discharge: 2013-09-30 | Disposition: A | Discharge status: Home / Self Care | Payer: Medicaid Other | Source: Ambulatory Visit | Attending: Orthopedic Surgery | Admitting: Orthopedic Surgery

## 2013-09-30 ENCOUNTER — Encounter: Payer: Medicaid Other | Admitting: Rehabilitation

## 2013-09-30 DIAGNOSIS — IMO0002 Reserved for concepts with insufficient information to code with codable children: Secondary | ICD-10-CM

## 2013-10-07 ENCOUNTER — Ambulatory Visit: Payer: No Typology Code available for payment source | Admitting: Rehabilitation

## 2013-10-08 ENCOUNTER — Ambulatory Visit: Payer: Medicaid Other | Admitting: Rehabilitation

## 2013-10-09 ENCOUNTER — Ambulatory Visit: Payer: Medicaid Other | Admitting: Rehabilitation

## 2013-10-14 ENCOUNTER — Ambulatory Visit: Payer: Medicaid Other | Admitting: Rehabilitation

## 2013-10-15 ENCOUNTER — Ambulatory Visit: Payer: Medicaid Other | Admitting: Rehabilitation

## 2013-10-20 ENCOUNTER — Ambulatory Visit: Payer: Medicaid Other | Attending: Orthopedic Surgery | Admitting: Rehabilitation

## 2013-10-20 DIAGNOSIS — R262 Difficulty in walking, not elsewhere classified: Secondary | ICD-10-CM | POA: Insufficient documentation

## 2013-10-20 DIAGNOSIS — M25559 Pain in unspecified hip: Secondary | ICD-10-CM | POA: Insufficient documentation

## 2013-10-20 DIAGNOSIS — M25569 Pain in unspecified knee: Secondary | ICD-10-CM | POA: Insufficient documentation

## 2013-10-20 DIAGNOSIS — IMO0001 Reserved for inherently not codable concepts without codable children: Secondary | ICD-10-CM | POA: Insufficient documentation

## 2013-10-21 ENCOUNTER — Ambulatory Visit: Payer: Medicaid Other | Admitting: Rehabilitation

## 2013-10-22 ENCOUNTER — Ambulatory Visit: Payer: Medicaid Other | Admitting: Rehabilitation

## 2013-10-27 ENCOUNTER — Ambulatory Visit: Payer: Medicaid Other | Admitting: Rehabilitation

## 2013-10-29 ENCOUNTER — Ambulatory Visit: Payer: Medicaid Other | Admitting: Rehabilitation

## 2013-10-30 ENCOUNTER — Ambulatory Visit: Payer: Medicaid Other | Admitting: Rehabilitation

## 2013-11-03 ENCOUNTER — Ambulatory Visit: Payer: Medicaid Other | Admitting: Rehabilitation

## 2013-11-05 ENCOUNTER — Ambulatory Visit: Payer: Medicaid Other | Admitting: Rehabilitation

## 2013-11-06 ENCOUNTER — Ambulatory Visit: Payer: Medicaid Other | Admitting: Rehabilitation

## 2013-11-12 ENCOUNTER — Ambulatory Visit: Payer: Medicaid Other | Admitting: Rehabilitation

## 2013-11-14 ENCOUNTER — Encounter: Payer: Medicaid Other | Admitting: Rehabilitation

## 2013-11-18 ENCOUNTER — Encounter: Payer: Medicaid Other | Admitting: Rehabilitation

## 2013-11-20 ENCOUNTER — Encounter: Payer: Medicaid Other | Admitting: Rehabilitation

## 2013-11-25 ENCOUNTER — Ambulatory Visit: Payer: Medicaid Other | Attending: Orthopedic Surgery | Admitting: Rehabilitation

## 2013-11-25 DIAGNOSIS — M25569 Pain in unspecified knee: Secondary | ICD-10-CM | POA: Insufficient documentation

## 2013-11-25 DIAGNOSIS — M25559 Pain in unspecified hip: Secondary | ICD-10-CM | POA: Insufficient documentation

## 2013-11-25 DIAGNOSIS — R262 Difficulty in walking, not elsewhere classified: Secondary | ICD-10-CM | POA: Insufficient documentation

## 2013-11-25 DIAGNOSIS — IMO0001 Reserved for inherently not codable concepts without codable children: Secondary | ICD-10-CM | POA: Insufficient documentation

## 2013-11-27 ENCOUNTER — Ambulatory Visit: Payer: Medicaid Other | Admitting: Rehabilitation

## 2013-12-02 ENCOUNTER — Ambulatory Visit: Payer: Medicaid Other | Admitting: Rehabilitation

## 2013-12-04 ENCOUNTER — Ambulatory Visit: Payer: Medicaid Other | Admitting: Rehabilitation

## 2013-12-08 ENCOUNTER — Ambulatory Visit: Payer: Medicaid Other | Attending: Orthopedic Surgery | Admitting: Rehabilitation

## 2013-12-08 DIAGNOSIS — M25559 Pain in unspecified hip: Secondary | ICD-10-CM | POA: Insufficient documentation

## 2013-12-08 DIAGNOSIS — R262 Difficulty in walking, not elsewhere classified: Secondary | ICD-10-CM | POA: Insufficient documentation

## 2013-12-08 DIAGNOSIS — IMO0001 Reserved for inherently not codable concepts without codable children: Secondary | ICD-10-CM | POA: Insufficient documentation

## 2013-12-08 DIAGNOSIS — M25569 Pain in unspecified knee: Secondary | ICD-10-CM | POA: Insufficient documentation

## 2013-12-09 ENCOUNTER — Ambulatory Visit: Payer: Medicaid Other | Admitting: Rehabilitation

## 2013-12-15 ENCOUNTER — Ambulatory Visit: Payer: Medicaid Other | Admitting: Rehabilitation

## 2013-12-17 ENCOUNTER — Ambulatory Visit: Payer: Medicaid Other | Admitting: Rehabilitation

## 2013-12-22 ENCOUNTER — Ambulatory Visit: Payer: Medicaid Other | Admitting: Rehabilitation

## 2013-12-24 ENCOUNTER — Ambulatory Visit: Payer: Medicaid Other | Admitting: Rehabilitation

## 2013-12-29 ENCOUNTER — Other Ambulatory Visit: Payer: Self-pay | Admitting: Physical Medicine & Rehabilitation

## 2013-12-29 ENCOUNTER — Other Ambulatory Visit: Payer: Self-pay | Admitting: Orthopedic Surgery

## 2013-12-29 ENCOUNTER — Ambulatory Visit: Payer: Medicaid Other | Admitting: Rehabilitation

## 2013-12-29 DIAGNOSIS — IMO0002 Reserved for concepts with insufficient information to code with codable children: Secondary | ICD-10-CM

## 2013-12-31 ENCOUNTER — Ambulatory Visit: Payer: Medicaid Other | Admitting: Rehabilitation

## 2014-01-01 ENCOUNTER — Telehealth: Payer: Self-pay

## 2014-01-01 NOTE — Telephone Encounter (Signed)
Pt will need to be seen here or go through PCP for RF

## 2014-01-01 NOTE — Telephone Encounter (Signed)
Pharmacy request received to refill patient's Citalopram. Patient last seen on 07/23/13. Please advise.

## 2014-01-02 ENCOUNTER — Other Ambulatory Visit: Payer: Self-pay

## 2014-01-02 DIAGNOSIS — F4310 Post-traumatic stress disorder, unspecified: Secondary | ICD-10-CM

## 2014-01-02 NOTE — Telephone Encounter (Signed)
Celexa refill refused per Dr. Riley Killswartz patient needs a appt or get refills from her PCP.

## 2014-01-08 ENCOUNTER — Other Ambulatory Visit: Payer: Self-pay | Admitting: *Deleted

## 2014-01-08 DIAGNOSIS — F4310 Post-traumatic stress disorder, unspecified: Secondary | ICD-10-CM

## 2014-01-12 ENCOUNTER — Ambulatory Visit
Admission: RE | Admit: 2014-01-12 | Discharge: 2014-01-12 | Disposition: A | Discharge status: Home / Self Care | Payer: Medicaid Other | Source: Ambulatory Visit | Attending: Orthopedic Surgery | Admitting: Orthopedic Surgery

## 2014-01-12 DIAGNOSIS — IMO0002 Reserved for concepts with insufficient information to code with codable children: Secondary | ICD-10-CM

## 2014-01-15 ENCOUNTER — Encounter (HOSPITAL_COMMUNITY): Payer: Self-pay | Admitting: Pharmacy Technician

## 2014-01-19 ENCOUNTER — Encounter (HOSPITAL_COMMUNITY)
Admission: RE | Admit: 2014-01-19 | Discharge: 2014-01-19 | Disposition: A | Discharge status: Home / Self Care | Payer: No Typology Code available for payment source | Source: Ambulatory Visit | Attending: Orthopedic Surgery | Admitting: Orthopedic Surgery

## 2014-01-19 ENCOUNTER — Encounter (HOSPITAL_COMMUNITY): Payer: Self-pay

## 2014-01-19 ENCOUNTER — Ambulatory Visit (HOSPITAL_COMMUNITY)
Admission: RE | Admit: 2014-01-19 | Discharge: 2014-01-19 | Disposition: A | Discharge status: Home / Self Care | Payer: No Typology Code available for payment source | Source: Ambulatory Visit | Attending: Anesthesiology | Admitting: Anesthesiology

## 2014-01-19 DIAGNOSIS — X58XXXA Exposure to other specified factors, initial encounter: Secondary | ICD-10-CM | POA: Insufficient documentation

## 2014-01-19 DIAGNOSIS — Z01818 Encounter for other preprocedural examination: Secondary | ICD-10-CM | POA: Insufficient documentation

## 2014-01-19 DIAGNOSIS — S82899A Other fracture of unspecified lower leg, initial encounter for closed fracture: Secondary | ICD-10-CM | POA: Diagnosis not present

## 2014-01-19 HISTORY — DX: Unspecified osteoarthritis, unspecified site: M19.90

## 2014-01-19 HISTORY — DX: Hypothyroidism, unspecified: E03.9

## 2014-01-19 HISTORY — DX: Depression, unspecified: F32.A

## 2014-01-19 HISTORY — DX: Major depressive disorder, single episode, unspecified: F32.9

## 2014-01-19 LAB — URINE MICROSCOPIC-ADD ON

## 2014-01-19 LAB — CBC WITH DIFFERENTIAL/PLATELET
Basophils Absolute: 0 10*3/uL (ref 0.0–0.1)
Basophils Relative: 0 % (ref 0–1)
EOS ABS: 0.1 10*3/uL (ref 0.0–0.7)
EOS PCT: 2 % (ref 0–5)
HCT: 40.7 % (ref 36.0–46.0)
HEMOGLOBIN: 13.3 g/dL (ref 12.0–15.0)
Lymphocytes Relative: 52 % — ABNORMAL HIGH (ref 12–46)
Lymphs Abs: 3.2 10*3/uL (ref 0.7–4.0)
MCH: 29.2 pg (ref 26.0–34.0)
MCHC: 32.7 g/dL (ref 30.0–36.0)
MCV: 89.5 fL (ref 78.0–100.0)
MONOS PCT: 6 % (ref 3–12)
Monocytes Absolute: 0.4 10*3/uL (ref 0.1–1.0)
NEUTROS PCT: 40 % — AB (ref 43–77)
Neutro Abs: 2.6 10*3/uL (ref 1.7–7.7)
Platelets: 285 10*3/uL (ref 150–400)
RBC: 4.55 MIL/uL (ref 3.87–5.11)
RDW: 13.2 % (ref 11.5–15.5)
WBC: 6.3 10*3/uL (ref 4.0–10.5)

## 2014-01-19 LAB — COMPREHENSIVE METABOLIC PANEL
ALBUMIN: 3.8 g/dL (ref 3.5–5.2)
ALT: 30 U/L (ref 0–35)
ANION GAP: 10 (ref 5–15)
AST: 25 U/L (ref 0–37)
Alkaline Phosphatase: 167 U/L — ABNORMAL HIGH (ref 39–117)
BUN: 10 mg/dL (ref 6–23)
CALCIUM: 10 mg/dL (ref 8.4–10.5)
CO2: 30 mEq/L (ref 19–32)
Chloride: 100 mEq/L (ref 96–112)
Creatinine, Ser: 0.61 mg/dL (ref 0.50–1.10)
GFR calc non Af Amer: >90 mL/min (ref >90)
GLUCOSE: 87 mg/dL (ref 70–99)
Potassium: 4.6 mEq/L (ref 3.7–5.3)
Sodium: 140 mEq/L (ref 137–147)
TOTAL PROTEIN: 8.2 g/dL (ref 6.0–8.3)
Total Bilirubin: 0.2 mg/dL — ABNORMAL LOW (ref 0.3–1.2)

## 2014-01-19 LAB — URINALYSIS, ROUTINE W REFLEX MICROSCOPIC
Bilirubin Urine: NEGATIVE
Glucose, UA: NEGATIVE mg/dL
Hgb urine dipstick: NEGATIVE
KETONES UR: NEGATIVE mg/dL
Nitrite: NEGATIVE
Protein, ur: NEGATIVE mg/dL
Specific Gravity, Urine: 1.017 (ref 1.005–1.030)
UROBILINOGEN UA: 0.2 mg/dL (ref 0.0–1.0)
pH: 7.5 (ref 5.0–8.0)

## 2014-01-19 LAB — PROTIME-INR
INR: 1.03 (ref 0.00–1.49)
PROTHROMBIN TIME: 13.5 s (ref 11.6–15.2)

## 2014-01-19 LAB — C-REACTIVE PROTEIN: CRP: <0.5 mg/dL — ABNORMAL LOW (ref <0.60)

## 2014-01-19 LAB — SEDIMENTATION RATE: SED RATE: 18 mm/h (ref 0–22)

## 2014-01-19 MED ORDER — CEFAZOLIN SODIUM-DEXTROSE 2-3 GM-% IV SOLR
2.0000 g | INTRAVENOUS | Status: AC
  Administered 2014-01-20: 2 g via INTRAVENOUS
  Filled 2014-01-19: qty 50

## 2014-01-19 MED ORDER — ACETAMINOPHEN 500 MG PO TABS
1000.0000 mg | ORAL_TABLET | Freq: Once | ORAL | Status: DC
  Filled 2014-01-19: qty 2

## 2014-01-19 NOTE — Pre-Procedure Instructions (Addendum)
[name]  01/19/2014   Your procedure is scheduled on:  01/20/14  [facility] short stay admitting at 600 AM.  Call this number if you have problems the morning of surgery: [phone]   Remember:   Do not eat food or drink liquids after midnight.   Take these medicines the morning of surgery with A SIP OF WATER: celexa, pepcid,        advair, pain med if needed,   Levothyroxine,     methocarbamol if needed        STOP all herbel meds, nsaids (aleve,naproxen,advil,ibuprofen) now including vitamins ,aspirin   Do not wear jewelry, make-up or nail polish.  Do not wear lotions, powders, or perfumes. You may wear deodorant.  Do not shave 48 hours prior to surgery. Men may shave face and neck.  Do not bring valuables to the hospital.  [facility] is not responsible                  for any belongings or valuables.               Contacts, dentures or bridgework may not be worn into surgery.  Leave suitcase in the car. After surgery it may be brought to your room.  For patients admitted to the hospital, discharge time is determined by your                treatment team.               Patients discharged the day of surgery will not be allowed to drive  home.  Name and phone number of your driver:   Special Instructions:  Special Instructions: [place] - Preparing for Surgery  Before surgery, you can play an important role.  Because skin is not sterile, your skin needs to be as free of germs as possible.  You can reduce the number of germs on you skin by washing with CHG (chlorahexidine gluconate) soap before surgery.  CHG is an antiseptic cleaner which kills germs and bonds with the skin to continue killing germs even after washing.  Please DO NOT use if you have an allergy to CHG or antibacterial soaps.  If your skin becomes reddened/irritated stop using the CHG and inform your nurse when you arrive at Short Stay.  Do not shave (including legs and underarms) for at least 48  hours prior to the first CHG shower.  You may shave your face.  Please follow these instructions carefully:   1.  Shower with CHG Soap the night before surgery and the morning of Surgery.  2.  If you choose to wash your hair, wash your hair first as usual with your normal shampoo.  3.  After you shampoo, rinse your hair and body thoroughly to remove the Shampoo.  4.  Use CHG as you would any other liquid soap.  You can apply chg directly  to the skin and wash gently with scrungie or a clean washcloth.  5.  Apply the CHG Soap to your body ONLY FROM THE NECK DOWN.  Do not use on open wounds or open sores.  Avoid contact with your eyes ears, mouth and genitals (private parts).  Wash genitals (private parts)       with your normal soap.  6.  Wash thoroughly, paying special attention to the area where your surgery will be performed.  7.  Thoroughly rinse your body with warm water from the neck down.  8.  DO NOT shower/wash with your normal soap after using and rinsing off the CHG Soap.  9.  Pat yourself dry with a clean towel.            10.  Wear clean pajamas.            11.  Place clean sheets on your bed the night of your first shower and do not sleep with pets.  Day of Surgery  Do not apply any lotions/deodorants the morning of surgery.  Please wear clean clothes to the hospital/surgery center.   Please read over the following fact sheets that you were given: Pain Booklet, Coughing and Deep Breathing and Surgical Site Infection Prevention

## 2014-01-20 ENCOUNTER — Inpatient Hospital Stay (HOSPITAL_COMMUNITY): Payer: No Typology Code available for payment source | Admitting: Anesthesiology

## 2014-01-20 ENCOUNTER — Encounter (HOSPITAL_COMMUNITY)
Admission: RE | Disposition: A | Discharge status: Home Health | Payer: Medicaid Other | Source: Ambulatory Visit | Attending: Orthopedic Surgery

## 2014-01-20 ENCOUNTER — Encounter (HOSPITAL_COMMUNITY): Payer: Self-pay | Admitting: *Deleted

## 2014-01-20 ENCOUNTER — Inpatient Hospital Stay (HOSPITAL_COMMUNITY)
Admission: RE | Admit: 2014-01-20 | Discharge: 2014-01-22 | DRG: 493 | Disposition: A | Discharge status: Home Health | Payer: No Typology Code available for payment source | Source: Ambulatory Visit | Attending: Orthopedic Surgery | Admitting: Orthopedic Surgery

## 2014-01-20 ENCOUNTER — Inpatient Hospital Stay (HOSPITAL_COMMUNITY): Payer: No Typology Code available for payment source

## 2014-01-20 ENCOUNTER — Observation Stay (HOSPITAL_COMMUNITY): Payer: No Typology Code available for payment source

## 2014-01-20 ENCOUNTER — Encounter (HOSPITAL_COMMUNITY): Payer: No Typology Code available for payment source | Admitting: Anesthesiology

## 2014-01-20 DIAGNOSIS — F329 Major depressive disorder, single episode, unspecified: Secondary | ICD-10-CM | POA: Diagnosis present

## 2014-01-20 DIAGNOSIS — E212 Other hyperparathyroidism: Secondary | ICD-10-CM | POA: Diagnosis present

## 2014-01-20 DIAGNOSIS — J45909 Unspecified asthma, uncomplicated: Secondary | ICD-10-CM | POA: Diagnosis present

## 2014-01-20 DIAGNOSIS — E559 Vitamin D deficiency, unspecified: Secondary | ICD-10-CM | POA: Diagnosis present

## 2014-01-20 DIAGNOSIS — S8290XS Unspecified fracture of unspecified lower leg, sequela: Secondary | ICD-10-CM

## 2014-01-20 DIAGNOSIS — Z79899 Other long term (current) drug therapy: Secondary | ICD-10-CM | POA: Diagnosis not present

## 2014-01-20 DIAGNOSIS — D62 Acute posthemorrhagic anemia: Secondary | ICD-10-CM | POA: Diagnosis not present

## 2014-01-20 DIAGNOSIS — F3289 Other specified depressive episodes: Secondary | ICD-10-CM | POA: Diagnosis present

## 2014-01-20 DIAGNOSIS — E039 Hypothyroidism, unspecified: Secondary | ICD-10-CM | POA: Diagnosis present

## 2014-01-20 DIAGNOSIS — IMO0002 Reserved for concepts with insufficient information to code with codable children: Secondary | ICD-10-CM | POA: Diagnosis present

## 2014-01-20 DIAGNOSIS — E213 Hyperparathyroidism, unspecified: Secondary | ICD-10-CM | POA: Diagnosis present

## 2014-01-20 DIAGNOSIS — M949 Disorder of cartilage, unspecified: Secondary | ICD-10-CM

## 2014-01-20 DIAGNOSIS — M899 Disorder of bone, unspecified: Secondary | ICD-10-CM | POA: Diagnosis present

## 2014-01-20 DIAGNOSIS — F32A Depression, unspecified: Secondary | ICD-10-CM | POA: Diagnosis present

## 2014-01-20 HISTORY — PX: ORIF TIBIA FRACTURE: SHX5416

## 2014-01-20 HISTORY — DX: Vitamin D deficiency, unspecified: E55.9

## 2014-01-20 HISTORY — DX: Hyperparathyroidism, unspecified: E21.3

## 2014-01-20 LAB — CREATININE, SERUM
CREATININE: 0.62 mg/dL (ref 0.50–1.10)
GFR calc Af Amer: >90 mL/min (ref >90)
GFR calc non Af Amer: >90 mL/min (ref >90)

## 2014-01-20 LAB — RAPID URINE DRUG SCREEN, HOSP PERFORMED
Amphetamines: NOT DETECTED
BARBITURATES: NOT DETECTED
BENZODIAZEPINES: NOT DETECTED
COCAINE: NOT DETECTED
Opiates: NOT DETECTED
TETRAHYDROCANNABINOL: NOT DETECTED

## 2014-01-20 LAB — CBC
HEMATOCRIT: 32.4 % — AB (ref 36.0–46.0)
HEMOGLOBIN: 10.8 g/dL — AB (ref 12.0–15.0)
MCH: 29.6 pg (ref 26.0–34.0)
MCHC: 33.3 g/dL (ref 30.0–36.0)
MCV: 88.8 fL (ref 78.0–100.0)
Platelets: 250 10*3/uL (ref 150–400)
RBC: 3.65 MIL/uL — AB (ref 3.87–5.11)
RDW: 13.2 % (ref 11.5–15.5)
WBC: 10.1 10*3/uL (ref 4.0–10.5)

## 2014-01-20 LAB — NICOTINE/COTININE METABOLITES: Cotinine: <10 ng/mL

## 2014-01-20 LAB — GRAM STAIN

## 2014-01-20 LAB — PREALBUMIN: PREALBUMIN: 23.7 mg/dL (ref 17.0–34.0)

## 2014-01-20 LAB — VITAMIN D 25 HYDROXY (VIT D DEFICIENCY, FRACTURES): Vit D, 25-Hydroxy: 22 ng/mL — ABNORMAL LOW (ref 30–89)

## 2014-01-20 SURGERY — OPEN REDUCTION INTERNAL FIXATION (ORIF) TIBIA FRACTURE
Anesthesia: General | Site: Leg Lower | Laterality: Right

## 2014-01-20 MED ORDER — ROCURONIUM BROMIDE 50 MG/5ML IV SOLN
INTRAVENOUS | Status: AC
  Filled 2014-01-20: qty 1

## 2014-01-20 MED ORDER — METHOCARBAMOL 1000 MG/10ML IJ SOLN
500.0000 mg | Freq: Four times a day (QID) | INTRAVENOUS | Status: DC | PRN
  Administered 2014-01-20: 500 mg via INTRAVENOUS
  Filled 2014-01-20: qty 5

## 2014-01-20 MED ORDER — HYDROMORPHONE HCL PF 1 MG/ML IJ SOLN
0.5000 mg | INTRAMUSCULAR | Status: DC | PRN
  Administered 2014-01-20 – 2014-01-22 (×5): 1 mg via INTRAVENOUS
  Filled 2014-01-20 (×5): qty 1

## 2014-01-20 MED ORDER — METOCLOPRAMIDE HCL 10 MG PO TABS: 5.0000 mg | ORAL_TABLET | Freq: Three times a day (TID) | ORAL | Status: DC | PRN

## 2014-01-20 MED ORDER — ONDANSETRON HCL 4 MG/2ML IJ SOLN
INTRAMUSCULAR | Status: DC | PRN
  Administered 2014-01-20: 4 mg via INTRAVENOUS

## 2014-01-20 MED ORDER — VITAMIN D (ERGOCALCIFEROL) 1.25 MG (50000 UNIT) PO CAPS
50000.0000 [IU] | ORAL_CAPSULE | ORAL | Status: DC
  Administered 2014-01-21: 50000 [IU] via ORAL
  Filled 2014-01-20: qty 1

## 2014-01-20 MED ORDER — ONDANSETRON HCL 4 MG/2ML IJ SOLN: 4.0000 mg | Freq: Once | INTRAMUSCULAR | Status: DC | PRN

## 2014-01-20 MED ORDER — LACTATED RINGERS IV SOLN: INTRAVENOUS | Status: DC

## 2014-01-20 MED ORDER — FENTANYL CITRATE 0.05 MG/ML IJ SOLN
INTRAMUSCULAR | Status: AC
  Filled 2014-01-20: qty 5

## 2014-01-20 MED ORDER — 0.9 % SODIUM CHLORIDE (POUR BTL) OPTIME
TOPICAL | Status: DC | PRN
  Administered 2014-01-20: 1000 mL

## 2014-01-20 MED ORDER — ONDANSETRON HCL 4 MG PO TABS: 4.0000 mg | ORAL_TABLET | Freq: Four times a day (QID) | ORAL | Status: DC | PRN

## 2014-01-20 MED ORDER — SUCCINYLCHOLINE CHLORIDE 20 MG/ML IJ SOLN
INTRAMUSCULAR | Status: AC
Start: 2014-01-20 — End: 2014-01-20
  Filled 2014-01-20: qty 1

## 2014-01-20 MED ORDER — METHOCARBAMOL 500 MG PO TABS
ORAL_TABLET | ORAL | Status: AC
  Filled 2014-01-20: qty 1

## 2014-01-20 MED ORDER — CEFAZOLIN SODIUM 1-5 GM-% IV SOLN
1.0000 g | Freq: Four times a day (QID) | INTRAVENOUS | Status: AC
  Administered 2014-01-20 – 2014-01-21 (×3): 1 g via INTRAVENOUS
  Filled 2014-01-20 (×3): qty 50

## 2014-01-20 MED ORDER — LEVOTHYROXINE SODIUM 50 MCG PO TABS
50.0000 ug | ORAL_TABLET | Freq: Every day | ORAL | Status: DC
  Administered 2014-01-21 – 2014-01-22 (×2): 50 ug via ORAL
  Filled 2014-01-20 (×3): qty 1

## 2014-01-20 MED ORDER — MOMETASONE FURO-FORMOTEROL FUM 100-5 MCG/ACT IN AERO
2.0000 | INHALATION_SPRAY | Freq: Two times a day (BID) | RESPIRATORY_TRACT | Status: DC
  Administered 2014-01-20 – 2014-01-22 (×4): 2 via RESPIRATORY_TRACT
  Filled 2014-01-20: qty 8.8

## 2014-01-20 MED ORDER — CHLORHEXIDINE GLUCONATE 4 % EX LIQD
60.0000 mL | Freq: Once | CUTANEOUS | Status: DC
  Filled 2014-01-20: qty 60

## 2014-01-20 MED ORDER — LABETALOL HCL 5 MG/ML IV SOLN
INTRAVENOUS | Status: DC | PRN
  Administered 2014-01-20: 10 mg via INTRAVENOUS

## 2014-01-20 MED ORDER — DIPHENHYDRAMINE HCL 12.5 MG/5ML PO ELIX: 12.5000 mg | ORAL_SOLUTION | ORAL | Status: DC | PRN

## 2014-01-20 MED ORDER — ONDANSETRON HCL 4 MG/2ML IJ SOLN: 4.0000 mg | Freq: Four times a day (QID) | INTRAMUSCULAR | Status: DC | PRN

## 2014-01-20 MED ORDER — NEOSTIGMINE METHYLSULFATE 10 MG/10ML IV SOLN
INTRAVENOUS | Status: DC | PRN
  Administered 2014-01-20: 3 mg via INTRAVENOUS

## 2014-01-20 MED ORDER — ENOXAPARIN SODIUM 40 MG/0.4ML ~~LOC~~ SOLN
40.0000 mg | SUBCUTANEOUS | Status: DC
  Administered 2014-01-21 – 2014-01-22 (×2): 40 mg via SUBCUTANEOUS
  Filled 2014-01-20 (×3): qty 0.4

## 2014-01-20 MED ORDER — MAGNESIUM HYDROXIDE 400 MG/5ML PO SUSP: 30.0000 mL | Freq: Every day | ORAL | Status: DC | PRN

## 2014-01-20 MED ORDER — GLYCOPYRROLATE 0.2 MG/ML IJ SOLN
INTRAMUSCULAR | Status: AC
  Filled 2014-01-20: qty 2

## 2014-01-20 MED ORDER — ACETAMINOPHEN 10 MG/ML IV SOLN
INTRAVENOUS | Status: AC
  Administered 2014-01-20: 1000 mg via INTRAVENOUS
  Filled 2014-01-20: qty 100

## 2014-01-20 MED ORDER — HYDROMORPHONE HCL PF 1 MG/ML IJ SOLN
INTRAMUSCULAR | Status: AC
  Filled 2014-01-20: qty 1

## 2014-01-20 MED ORDER — PROPOFOL 10 MG/ML IV BOLUS
INTRAVENOUS | Status: DC | PRN
  Administered 2014-01-20: 140 mg via INTRAVENOUS

## 2014-01-20 MED ORDER — OXYCODONE-ACETAMINOPHEN 5-325 MG PO TABS
1.0000 | ORAL_TABLET | Freq: Four times a day (QID) | ORAL | Status: DC | PRN
  Filled 2014-01-20: qty 2

## 2014-01-20 MED ORDER — CITALOPRAM HYDROBROMIDE 20 MG PO TABS
20.0000 mg | ORAL_TABLET | Freq: Every morning | ORAL | Status: DC
  Administered 2014-01-20 – 2014-01-22 (×3): 20 mg via ORAL
  Filled 2014-01-20 (×3): qty 1

## 2014-01-20 MED ORDER — PHENYLEPHRINE 40 MCG/ML (10ML) SYRINGE FOR IV PUSH (FOR BLOOD PRESSURE SUPPORT)
PREFILLED_SYRINGE | INTRAVENOUS | Status: AC
  Filled 2014-01-20: qty 10

## 2014-01-20 MED ORDER — LACTATED RINGERS IV SOLN
INTRAVENOUS | Status: DC
  Administered 2014-01-20: 07:00:00 via INTRAVENOUS

## 2014-01-20 MED ORDER — SODIUM CHLORIDE 0.9 % IJ SOLN
INTRAMUSCULAR | Status: AC
  Filled 2014-01-20: qty 10

## 2014-01-20 MED ORDER — ONDANSETRON HCL 4 MG/2ML IJ SOLN
INTRAMUSCULAR | Status: AC
  Filled 2014-01-20: qty 2

## 2014-01-20 MED ORDER — PHENYLEPHRINE HCL 10 MG/ML IJ SOLN
INTRAMUSCULAR | Status: DC | PRN
  Administered 2014-01-20 (×6): 80 ug via INTRAVENOUS

## 2014-01-20 MED ORDER — HYDROMORPHONE HCL PF 1 MG/ML IJ SOLN
0.2500 mg | INTRAMUSCULAR | Status: DC | PRN
  Administered 2014-01-20 (×3): 0.5 mg via INTRAVENOUS

## 2014-01-20 MED ORDER — METOCLOPRAMIDE HCL 5 MG/ML IJ SOLN: 5.0000 mg | Freq: Three times a day (TID) | INTRAMUSCULAR | Status: DC | PRN

## 2014-01-20 MED ORDER — LIDOCAINE HCL (CARDIAC) 20 MG/ML IV SOLN
INTRAVENOUS | Status: AC
  Filled 2014-01-20: qty 5

## 2014-01-20 MED ORDER — DOCUSATE SODIUM 100 MG PO CAPS
100.0000 mg | ORAL_CAPSULE | Freq: Two times a day (BID) | ORAL | Status: DC
Start: 2014-01-20 — End: 2014-01-21
  Administered 2014-01-20: 100 mg via ORAL
  Filled 2014-01-20 (×2): qty 1

## 2014-01-20 MED ORDER — LIDOCAINE HCL (CARDIAC) 20 MG/ML IV SOLN
INTRAVENOUS | Status: DC | PRN
  Administered 2014-01-20: 80 mg via INTRAVENOUS

## 2014-01-20 MED ORDER — ROCURONIUM BROMIDE 100 MG/10ML IV SOLN
INTRAVENOUS | Status: DC | PRN
  Administered 2014-01-20: 50 mg via INTRAVENOUS
  Administered 2014-01-20: 20 mg via INTRAVENOUS
  Administered 2014-01-20: 30 mg via INTRAVENOUS

## 2014-01-20 MED ORDER — MIDAZOLAM HCL 2 MG/2ML IJ SOLN
INTRAMUSCULAR | Status: AC
  Filled 2014-01-20: qty 2

## 2014-01-20 MED ORDER — EPHEDRINE SULFATE 50 MG/ML IJ SOLN
INTRAMUSCULAR | Status: AC
  Filled 2014-01-20: qty 1

## 2014-01-20 MED ORDER — POTASSIUM CHLORIDE IN NACL 20-0.9 MEQ/L-% IV SOLN
INTRAVENOUS | Status: DC
  Administered 2014-01-20 – 2014-01-21 (×2): via INTRAVENOUS
  Filled 2014-01-20 (×3): qty 1000

## 2014-01-20 MED ORDER — OXYCODONE HCL 5 MG PO TABS: 5.0000 mg | ORAL_TABLET | Freq: Once | ORAL | Status: AC | PRN

## 2014-01-20 MED ORDER — LACTATED RINGERS IV SOLN
INTRAVENOUS | Status: DC | PRN
  Administered 2014-01-20 (×3): via INTRAVENOUS

## 2014-01-20 MED ORDER — OXYCODONE HCL 5 MG PO TABS
5.0000 mg | ORAL_TABLET | ORAL | Status: DC | PRN
  Administered 2014-01-20 – 2014-01-21 (×4): 10 mg via ORAL
  Filled 2014-01-20 (×5): qty 2

## 2014-01-20 MED ORDER — METHOCARBAMOL 500 MG PO TABS
500.0000 mg | ORAL_TABLET | Freq: Four times a day (QID) | ORAL | Status: DC | PRN
  Administered 2014-01-20: 500 mg via ORAL
  Filled 2014-01-20: qty 2
  Filled 2014-01-20: qty 1

## 2014-01-20 MED ORDER — OXYCODONE HCL 5 MG/5ML PO SOLN
ORAL | Status: AC
  Administered 2014-01-20: 5 mg
  Filled 2014-01-20: qty 10

## 2014-01-20 MED ORDER — OXYCODONE HCL 5 MG PO TABS
ORAL_TABLET | ORAL | Status: AC
  Filled 2014-01-20: qty 2

## 2014-01-20 MED ORDER — OXYCODONE HCL 5 MG/5ML PO SOLN
5.0000 mg | Freq: Once | ORAL | Status: AC | PRN
  Administered 2014-01-20: 5 mg via ORAL

## 2014-01-20 MED ORDER — MIDAZOLAM HCL 5 MG/5ML IJ SOLN
INTRAMUSCULAR | Status: DC | PRN
  Administered 2014-01-20: 2 mg via INTRAVENOUS

## 2014-01-20 MED ORDER — BISACODYL 5 MG PO TBEC: 5.0000 mg | DELAYED_RELEASE_TABLET | Freq: Every day | ORAL | Status: DC | PRN

## 2014-01-20 MED ORDER — GLYCOPYRROLATE 0.2 MG/ML IJ SOLN
INTRAMUSCULAR | Status: DC | PRN
  Administered 2014-01-20: .4 mg via INTRAVENOUS

## 2014-01-20 MED ORDER — NEOSTIGMINE METHYLSULFATE 10 MG/10ML IV SOLN
INTRAVENOUS | Status: AC
  Filled 2014-01-20: qty 1

## 2014-01-20 MED ORDER — FAMOTIDINE 20 MG PO TABS
20.0000 mg | ORAL_TABLET | Freq: Two times a day (BID) | ORAL | Status: DC
  Administered 2014-01-20 – 2014-01-22 (×5): 20 mg via ORAL
  Filled 2014-01-20 (×6): qty 1

## 2014-01-20 MED ORDER — PROPOFOL 10 MG/ML IV BOLUS
INTRAVENOUS | Status: AC
  Filled 2014-01-20: qty 20

## 2014-01-20 MED ORDER — FLUTICASONE PROPIONATE 50 MCG/ACT NA SUSP
1.0000 | Freq: Every day | NASAL | Status: DC
  Administered 2014-01-20: 1 via NASAL
  Filled 2014-01-20: qty 16

## 2014-01-20 MED ORDER — FENTANYL CITRATE 0.05 MG/ML IJ SOLN
INTRAMUSCULAR | Status: DC | PRN
  Administered 2014-01-20 (×3): 100 ug via INTRAVENOUS
  Administered 2014-01-20: 50 ug via INTRAVENOUS
  Administered 2014-01-20: 150 ug via INTRAVENOUS
  Administered 2014-01-20: 100 ug via INTRAVENOUS

## 2014-01-20 SURGICAL SUPPLY — 94 items
BANDAGE ELASTIC 4 VELCRO ST LF (GAUZE/BANDAGES/DRESSINGS) ×3 IMPLANT
BANDAGE ELASTIC 6 VELCRO ST LF (GAUZE/BANDAGES/DRESSINGS) ×3 IMPLANT
BANDAGE ESMARK 6X9 LF (GAUZE/BANDAGES/DRESSINGS) ×1 IMPLANT
BANDAGE GAUZE ELAST BULKY 4 IN (GAUZE/BANDAGES/DRESSINGS) ×3 IMPLANT
BIT DRILL 100X2.5XANTM LCK (BIT) ×1 IMPLANT
BIT DRILL CAL (BIT) ×1 IMPLANT
BIT DRL 100X2.5XANTM LCK (BIT) ×1
BLADE SURG 10 STRL SS (BLADE) ×3 IMPLANT
BLADE SURG 15 STRL LF DISP TIS (BLADE) IMPLANT
BLADE SURG 15 STRL SS (BLADE)
BLADE SURG ROTATE 9660 (MISCELLANEOUS) IMPLANT
BNDG COHESIVE 4X5 TAN STRL (GAUZE/BANDAGES/DRESSINGS) IMPLANT
BNDG COHESIVE 6X5 TAN STRL LF (GAUZE/BANDAGES/DRESSINGS) ×3 IMPLANT
BNDG ESMARK 6X9 LF (GAUZE/BANDAGES/DRESSINGS) ×3
BRUSH SCRUB DISP (MISCELLANEOUS) ×6 IMPLANT
CANISTER SUCTION WELLS/JOHNSON (MISCELLANEOUS) ×3 IMPLANT
CONT SPEC 4OZ CLIKSEAL STRL BL (MISCELLANEOUS) ×6 IMPLANT
COVER MAYO STAND STRL (DRAPES) IMPLANT
DRAPE C-ARM 42X72 X-RAY (DRAPES) ×3 IMPLANT
DRAPE C-ARMOR (DRAPES) ×3 IMPLANT
DRAPE INCISE IOBAN 66X45 STRL (DRAPES) ×3 IMPLANT
DRAPE ORTHO SPLIT 77X108 STRL (DRAPES) ×2
DRAPE SURG ORHT 6 SPLT 77X108 (DRAPES) ×1 IMPLANT
DRAPE U-SHAPE 47X51 STRL (DRAPES) ×3 IMPLANT
DRILL BIT 2.5MM (BIT) ×2
DRILL BIT CAL (BIT) ×3
DRSG ADAPTIC 3X8 NADH LF (GAUZE/BANDAGES/DRESSINGS) ×3 IMPLANT
DRSG PAD ABDOMINAL 8X10 ST (GAUZE/BANDAGES/DRESSINGS) ×3 IMPLANT
ELECT REM PT RETURN 9FT ADLT (ELECTROSURGICAL) ×3
ELECTRODE REM PT RTRN 9FT ADLT (ELECTROSURGICAL) ×1 IMPLANT
EVACUATOR 1/8 PVC DRAIN (DRAIN) IMPLANT
EVACUATOR 3/16  PVC DRAIN (DRAIN)
EVACUATOR 3/16 PVC DRAIN (DRAIN) IMPLANT
GLOVE BIO SURGEON STRL SZ7.5 (GLOVE) ×3 IMPLANT
GLOVE BIO SURGEON STRL SZ8 (GLOVE) ×3 IMPLANT
GLOVE BIOGEL PI IND STRL 7.5 (GLOVE) ×1 IMPLANT
GLOVE BIOGEL PI IND STRL 8 (GLOVE) ×1 IMPLANT
GLOVE BIOGEL PI INDICATOR 7.5 (GLOVE) ×2
GLOVE BIOGEL PI INDICATOR 8 (GLOVE) ×2
GOWN STRL REUS W/ TWL LRG LVL3 (GOWN DISPOSABLE) ×2 IMPLANT
GOWN STRL REUS W/ TWL XL LVL3 (GOWN DISPOSABLE) ×2 IMPLANT
GOWN STRL REUS W/TWL LRG LVL3 (GOWN DISPOSABLE) ×4
GOWN STRL REUS W/TWL XL LVL3 (GOWN DISPOSABLE) ×4
GUIDEWIRE BALL NOSE 80CM (WIRE) ×3 IMPLANT
IMMOBILIZER KNEE 22 UNIV (SOFTGOODS) IMPLANT
K-WIRE ACE 1.6X6 (WIRE) ×6
KIT BASIN OR (CUSTOM PROCEDURE TRAY) ×3 IMPLANT
KIT INFUSE LRG II (Orthopedic Implant) ×3 IMPLANT
KIT ROOM TURNOVER OR (KITS) ×3 IMPLANT
KWIRE ACE 1.6X6 (WIRE) ×2 IMPLANT
MANIFOLD NEPTUNE II (INSTRUMENTS) IMPLANT
NDL SUT .5 MAYO 1.404X.05X (NEEDLE) IMPLANT
NEEDLE 22X1 1/2 (OR ONLY) (NEEDLE) IMPLANT
NEEDLE MAYO TAPER (NEEDLE)
NS IRRIG 1000ML POUR BTL (IV SOLUTION) ×3 IMPLANT
PACK ORTHO EXTREMITY (CUSTOM PROCEDURE TRAY) ×3 IMPLANT
PAD ARMBOARD 7.5X6 YLW CONV (MISCELLANEOUS) ×6 IMPLANT
PAD CAST 4YDX4 CTTN HI CHSV (CAST SUPPLIES) IMPLANT
PADDING CAST COTTON 4X4 STRL (CAST SUPPLIES)
PADDING CAST COTTON 6X4 STRL (CAST SUPPLIES) IMPLANT
PLATE LOCK 13H STD RT PROX TIB (Plate) ×3 IMPLANT
SCREW CORT 3.5X30 815037030 (Screw) ×3 IMPLANT
SCREW CORT 3.5X32 815037032 (Screw) ×3 IMPLANT
SCREW CORTICAL 3.5MM  34MM (Screw) ×2 IMPLANT
SCREW CORTICAL 3.5MM 34MM (Screw) ×1 IMPLANT
SCREW CORTICAL 3.5MM 36MM (Screw) ×3 IMPLANT
SCREW LOCK 3.5X55 (Screw) ×3 IMPLANT
SCREW LOCK 3.5X70 816135070 (Screw) ×6 IMPLANT
SCREW LOCK CORT STAR 3.5X28 (Screw) ×3 IMPLANT
SCREW LP 3.5X70MM (Screw) ×6 IMPLANT
SPONGE GAUZE 4X4 12PLY (GAUZE/BANDAGES/DRESSINGS) ×6 IMPLANT
SPONGE GAUZE 4X4 12PLY STER LF (GAUZE/BANDAGES/DRESSINGS) ×3 IMPLANT
SPONGE LAP 18X18 X RAY DECT (DISPOSABLE) ×6 IMPLANT
STAPLER VISISTAT 35W (STAPLE) ×3 IMPLANT
STOCKINETTE IMPERVIOUS LG (DRAPES) ×3 IMPLANT
SUCTION FRAZIER TIP 10 FR DISP (SUCTIONS) ×3 IMPLANT
SUT ETHILON 3 0 PS 1 (SUTURE) ×9 IMPLANT
SUT PROLENE 0 CT 2 (SUTURE) ×3 IMPLANT
SUT VIC AB 0 CT1 27 (SUTURE) ×2
SUT VIC AB 0 CT1 27XBRD ANBCTR (SUTURE) ×1 IMPLANT
SUT VIC AB 1 CT1 27 (SUTURE) ×2
SUT VIC AB 1 CT1 27XBRD ANBCTR (SUTURE) ×1 IMPLANT
SUT VIC AB 2-0 CT1 27 (SUTURE) ×6
SUT VIC AB 2-0 CT1 TAPERPNT 27 (SUTURE) ×3 IMPLANT
SWAB COLLECTION DEVICE MRSA (MISCELLANEOUS) ×3 IMPLANT
SYR 20ML ECCENTRIC (SYRINGE) IMPLANT
TOWEL OR 17X24 6PK STRL BLUE (TOWEL DISPOSABLE) ×3 IMPLANT
TOWEL OR 17X26 10 PK STRL BLUE (TOWEL DISPOSABLE) ×6 IMPLANT
TRAY FOLEY CATH 14FRSI W/METER (CATHETERS) ×3 IMPLANT
TRAY FOLEY CATH 16FRSI W/METER (SET/KITS/TRAYS/PACK) IMPLANT
TUBE ANAEROBIC SPECIMEN COL (MISCELLANEOUS) ×3 IMPLANT
TUBE CONNECTING 12'X1/4 (SUCTIONS) ×1
TUBE CONNECTING 12X1/4 (SUCTIONS) ×2 IMPLANT
YANKAUER SUCT BULB TIP NO VENT (SUCTIONS) ×3 IMPLANT

## 2014-01-20 NOTE — Anesthesia Postprocedure Evaluation (Signed)
  Anesthesia Post-op Note  Patient: [name]  Procedure(s) Performed: Procedure(s): OPEN REDUCTION INTERNAL FIXATION (ORIF) RIGHT PROXIMAL TIBIA FRACTURE/HARDWARE REMOVAL (Right)  Patient Location: PACU  Anesthesia Type:General  Level of Consciousness: awake, alert  and oriented  Airway and Oxygen Therapy: Patient Spontanous Breathing and Patient connected to nasal cannula oxygen  Post-op Pain: mild  Post-op Assessment: Post-op Vital signs reviewed, Patient's Cardiovascular Status Stable, Respiratory Function Stable and Pain level controlled  Post-op Vital Signs: stable  Last Vitals:  Filed Vitals:   01/20/14 1658  BP: 112/59  Pulse: 66  Temp: 36.4 C  Resp: 18    Complications: No apparent anesthesia complications

## 2014-01-20 NOTE — H&P (Signed)
I have seen and examined the patient. I agree with the findings above.  I discussed with the patient the risks and benefits of surgery for right tibia nonunion repair, including the possibility of infection, nerve injury, vessel injury, wound breakdown, arthritis, symptomatic hardware, DVT/ PE, loss of motion, and need for further surgery among others.  She understood these risks and wished to proceed.   Budd PalmerHANDY,Venisha Boehning H, MD 01/20/2014 7:55 AM
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
## 2014-01-20 NOTE — Progress Notes (Signed)
Orthopedic Tech Progress Note Patient Details:  [name] 1961-01-29 [id number] OHF applied to bed Patient ID: [name], female   DOB: 1961-01-29, 53 y.o.   MRN: [id number]   [name] 01/20/2014, 3:19 PM

## 2014-01-20 NOTE — Transfer of Care (Signed)
Immediate Anesthesia Transfer of Care Note  Patient: [name]  Procedure(s) Performed: Procedure(s): OPEN REDUCTION INTERNAL FIXATION (ORIF) RIGHT PROXIMAL TIBIA FRACTURE/HARDWARE REMOVAL (Right)  Patient Location: PACU  Anesthesia Type:General  Level of Consciousness: sedated  Airway & Oxygen Therapy: Patient Spontanous Breathing and Patient connected to nasal cannula oxygen  Post-op Assessment: Report given to PACU RN and Post -op Vital signs reviewed and stable  Post vital signs: Reviewed and stable  Complications: No apparent anesthesia complications

## 2014-01-20 NOTE — Anesthesia Preprocedure Evaluation (Signed)
Anesthesia Evaluation  Patient identified by MRN, date of birth, ID band Patient awake    Reviewed: Allergy & Precautions, H&P , NPO status , Patient's Chart, lab work & pertinent test results  Airway Mallampati: I TM Distance: >3 FB Neck ROM: Full    Dental  (+) Teeth Intact, Dental Advisory Given   Pulmonary asthma ,  breath sounds clear to auscultation        Cardiovascular Rhythm:Regular Rate:Normal     Neuro/Psych    GI/Hepatic   Endo/Other  Hypothyroidism   Renal/GU      Musculoskeletal   Abdominal   Peds  Hematology   Anesthesia Other Findings   Reproductive/Obstetrics                           Anesthesia Physical Anesthesia Plan  ASA: II  Anesthesia Plan: General   Post-op Pain Management:    Induction: Intravenous  Airway Management Planned: Oral ETT  Additional Equipment:   Intra-op Plan:   Post-operative Plan: Extubation in OR  Informed Consent: I have reviewed the patients History and Physical, chart, labs and discussed the procedure including the risks, benefits and alternatives for the proposed anesthesia with the patient or authorized representative who has indicated his/her understanding and acceptance.   Dental advisory given  Plan Discussed with: CRNA, Anesthesiologist and Surgeon  Anesthesia Plan Comments:         Anesthesia Quick Evaluation

## 2014-01-20 NOTE — Brief Op Note (Signed)
01/20/2014  11:50 AM  PATIENT:  [name]  53 y.o. female  PRE-OPERATIVE DIAGNOSIS:  RIGHT PROXIMAL TIBIA NONUNION  POST-OPERATIVE DIAGNOSIS:  RIGHT PROXIMAL TIBIA NONUNION  PROCEDURE:  Procedure(s): 1. REPAIR OF TIBIAL NONUNION RIGHT WITH PLATING, AUTOGRAFTING, AND INFUSE 2. REMOVAL OF HARDWARE RIGHT TIBIA  SURGEON:  Surgeon(s) and Role:    * Budd PalmerMichael H Theone Bowell, MD - Primary  PHYSICIAN ASSISTANT: Montez MoritaKeith Paul, PA-C  ANESTHESIA:   general  I/O:  Total I/O In: 2000 [I.V.:2000] Out: 350 [Urine:250; Blood:100]  SPECIMEN:  Source of Specimen:  REAMINGS RIGHT TIBIA  DISPOSITION OF SPECIMEN:  To micro  TOURNIQUET:    DICTATION: .Other Dictation: Dictation Number [id number]

## 2014-01-21 DIAGNOSIS — IMO0002 Reserved for concepts with insufficient information to code with codable children: Secondary | ICD-10-CM | POA: Insufficient documentation

## 2014-01-21 LAB — CBC
HCT: 31 % — ABNORMAL LOW (ref 36.0–46.0)
HEMOGLOBIN: 10.2 g/dL — AB (ref 12.0–15.0)
MCH: 29.6 pg (ref 26.0–34.0)
MCHC: 32.9 g/dL (ref 30.0–36.0)
MCV: 89.9 fL (ref 78.0–100.0)
Platelets: 224 10*3/uL (ref 150–400)
RBC: 3.45 MIL/uL — ABNORMAL LOW (ref 3.87–5.11)
RDW: 13.4 % (ref 11.5–15.5)
WBC: 10 10*3/uL (ref 4.0–10.5)

## 2014-01-21 LAB — VITAMIN D 1,25 DIHYDROXY
VITAMIN D 1, 25 (OH) TOTAL: 73 pg/mL — AB (ref 18–72)
Vitamin D2 1, 25 (OH)2: <8 pg/mL
Vitamin D3 1, 25 (OH)2: 73 pg/mL

## 2014-01-21 LAB — PTH, INTACT AND CALCIUM
Calcium, Total (PTH): 9.2 mg/dL (ref 8.4–10.5)
PTH: 78 pg/mL — ABNORMAL HIGH (ref 14.0–72.0)

## 2014-01-21 MED ORDER — DOCUSATE SODIUM 50 MG/5ML PO LIQD
100.0000 mg | Freq: Two times a day (BID) | ORAL | Status: DC
  Administered 2014-01-21 – 2014-01-22 (×3): 100 mg via ORAL
  Filled 2014-01-21 (×5): qty 10

## 2014-01-21 NOTE — Evaluation (Signed)
Occupational Therapy Evaluation Patient Details Name: [name] MRN: [id number] DOB: Dec 30, 1960 Today's Date: 01/21/2014    History of Present Illness Repair of tibial nonunion with plating, autografting, and infuse.  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   Clinical Impression   Patient presents with increased pain, edema in right leg, and dependent behavior relating to managing her own body and her care.  Patient's daughter present for OT evaluation.  Daughter and other family members have been assisting patient with ADL's since injury in 04/2013.  Patient will benefit from skilled OT services to decrease amount of asssitance needed with BADL prior to discharge home.  Patient has and is familiar with necessary DME for home bathroom use.      Follow Up Recommendations  Home health OT    Equipment Recommendations  None recommended by OT    Recommendations for Other Services       Precautions / Restrictions Precautions Precautions: Fall Restrictions Weight Bearing Restrictions: Yes RLE Weight Bearing: Weight bearing as tolerated      Mobility Bed Mobility Overal bed mobility: Needs Assistance Bed Mobility: Supine to Sit     Supine to sit: Min assist     General bed mobility comments: Increased time and coaxing  Transfers Overall transfer level: Needs assistance Equipment used: Rolling walker (2 wheeled) Transfers: Sit to/from UGI CorporationStand;Stand Pivot Transfers Sit to Stand: Mod assist (min from elevated surface) Stand pivot transfers: Min assist            Balance Overall balance assessment: Needs assistance Sitting-balance support: No upper extremity supported;Feet supported Sitting balance-Leahy Scale: Good Sitting balance - Comments: prefers lean toward left   Standing balance support: Bilateral upper extremity supported;During functional activity Standing balance-Leahy Scale: Poor                              ADL  Overall ADL's : Needs assistance/impaired     Grooming: Minimal assistance;Sitting   Upper Body Bathing: Minimal assitance;Sitting   Lower Body Bathing: Maximal assistance;Sit to/from stand       Lower Body Dressing: Maximal assistance;Sit to/from stand   Toilet Transfer: Moderate assistance;BSC;RW;Stand-pivot   Toileting- Clothing Manipulation and Hygiene: Minimal assistance;Sit to/from stand       Functional mobility during ADLs: Moderate assistance General ADL Comments: not accepting weight on RLE     Vision                     Perception Perception Perception Tested?: No   Praxis Praxis Praxis tested?: Not tested    Pertinent Vitals/Pain Pain 7/10 right leg - constant.      Hand Dominance Right   Extremity/Trunk Assessment Upper Extremity Assessment Upper Extremity Assessment: Overall WFL for tasks assessed   Lower Extremity Assessment Lower Extremity Assessment: Defer to PT evaluation   Cervical / Trunk Assessment Cervical / Trunk Assessment: Normal   Communication Communication Communication: No difficulties   Cognition Arousal/Alertness: Awake/alert Behavior During Therapy: Restless;Anxious Overall Cognitive Status:  (Patient very distracted by pain.  Family very attentive to patient's list of concerns  and frustrations)                     General Comments       Exercises       Shoulder Instructions      Home Living Family/patient expects to be discharged to:: Private residence Living Arrangements: Spouse/significant other;Children Available  Help at Discharge: Family;Available 24 hours/day Type of Home: House Home Access: Level entry     Home Layout: One level     Bathroom Shower/Tub: Tub/shower unit;Curtain         Home Equipment: Environmental consultant - 2 wheels;Cane - single point;Bedside commode;Tub bench;Wheelchair - manual          Prior Functioning/Environment Level of Independence: Needs assistance    ADL's /  Homemaking Assistance Needed: Daughter helped to bathe, dress patient        OT Diagnosis: Generalized weakness;Cognitive deficits;Acute pain   OT Problem List: Decreased strength;Impaired balance (sitting and/or standing);Decreased cognition;Decreased knowledge of precautions;Decreased range of motion;Decreased safety awareness;Increased edema;Decreased activity tolerance;Decreased knowledge of use of DME or AE   OT Treatment/Interventions: Self-care/ADL training;DME and/or AE instruction;Therapeutic activities;Balance training;Therapeutic exercise;Cognitive remediation/compensation;Energy conservation;Patient/family education    OT Goals(Current goals can be found in the care plan section) Acute Rehab OT Goals Patient Stated Goal: relieve pain OT Goal Formulation: With patient/family Time For Goal Achievement: 01/28/14 Potential to Achieve Goals: Good  OT Frequency: Min 3X/week   Barriers to D/C:    patient has accessible home and good family support       Co-evaluation              End of Session Equipment Utilized During Treatment: Engineer, water Communication: Mobility status  Activity Tolerance: Patient limited by pain Patient left: in chair;with call bell/phone within reach;with family/visitor present   Time: 1030-1106 OT Time Calculation (min): 36 min Charges:  OT General Charges $OT Visit: 1 Procedure OT Evaluation $Initial OT Evaluation Tier I: 1 Procedure OT Treatments $Self Care/Home Management : 23-37 mins G-Codes:    Collier Salina 02-16-14, 11:22 AM

## 2014-01-21 NOTE — Progress Notes (Signed)
[facility] Progress Note  Subjective  Uncomfortable and having pain  Some nausea overnight Did not eat breakfast this am  Review of Systems  Constitutional: Negative for fever and chills.  Eyes: Negative for blurred vision.  Respiratory: Negative for shortness of breath and wheezing.   Cardiovascular: Negative for chest pain and palpitations.  Gastrointestinal: Positive for nausea. Negative for vomiting and abdominal pain.  Genitourinary: Negative for dysuria.  Musculoskeletal:       R leg pain   Neurological: Negative for tingling, sensory change and headaches.    Objective   BP 115/58  Pulse 75  Temp(Src) 98.3 F (36.8 C) (Oral)  Resp 18  Wt 75.297 kg (166 lb)  SpO2 99%  Intake/Output     07/14 0701 - 07/15 0700 07/15 0701 - 07/16 0700   P.O. 240    I.V. (mL/kg) 3400 (45.2)    Total Intake(mL/kg) 3640 (48.3)    Urine (mL/kg/hr) 1275 (0.7)    Blood 100 (0.1)    Total Output 1375     Net +2265            Labs  Results for [name] [name], [name] (MRN [id number]) as of 01/21/2014 08:51  Ref. Range 01/21/2014 00:40  WBC Latest Range: 4.0-10.5 K/uL 10.0  RBC Latest Range: 3.87-5.11 MIL/uL 3.45 (L)  Hemoglobin Latest Range: 12.0-15.0 g/dL 78.210.2 (L)  HCT Latest Range: 36.0-46.0 % 31.0 (L)  MCV Latest Range: 78.0-100.0 fL 89.9  MCH Latest Range: 26.0-34.0 pg 29.6  MCHC Latest Range: 30.0-36.0 g/dL 95.632.9  RDW Latest Range: 11.5-15.5 % 13.4  Platelets Latest Range: 150-400 K/uL 224   Results for [name] [name], [name] [name] (MRN [id number]) as of 01/21/2014 08:51  Ref. Range 01/19/2014 14:43  Vit D, 25-Hydroxy Latest Range: 30-89 ng/mL 22 (L)    Exam  Gen: doing ok, resting comfortably in bed, leg propped up on pillows  Lungs: unlabored  Cardiac: reg Ext:       Right Lower Extremity   Dressing stable  Leg elevated  Distal motor and sensory functions intact  Ext warm  + DP pulse  Swelling stable   Compartments soft and NT  No pain with passive stretch      Assessment and Plan   POD/HD#: 1    1. 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 Pain management:  Continue with current regimen  3. ABL anemia/Hemodynamics  Stable  4. Medical issues     Vitamin D insufficiency- vitamin D2 50,000 IUs weekly x 8 weeks  Asthma/depression- home meds  Hypothyroidism- home meds  5. DVT/PE prophylaxis:  Lovenox  scds  Mobilize  6. ID:   Completed periop abx  7. Metabolic Bone Disease:  See #4  Additional labs pending   Nicotine negative   8. Activity:  WBAT R leg  ROM as tolerated R knee and ankle  9. FEN/Foley/Lines:  Advance diet as tolerated   Ok to NSL iv  10. Impediments to fracture healing:  Vitamin d deficiency   Thyroid dysfunction   11. Dispo:  Therapies   Pt needs to at least get to chair today, would like for her to ambulates some as well  Possible dc home tomorrow    Mearl LatinKeith W. Binta Statzer, PA-C [facility] [phone] (P) 01/21/2014 8:50 AM  **Disclaimer: This note may have been  dictated with voice recognition software. Similar sounding words can inadvertently be transcribed and this note may contain transcription errors which may not have been corrected upon publication of note.**

## 2014-01-21 NOTE — Care Management Note (Signed)
CARE MANAGEMENT NOTE 01/21/2014  Patient:  [name],Judyth   Account Number:  [id number]  Date Initiated:  01/21/2014  Documentation initiated by:  Vance PeperBRADY,Sydni Elizarraraz  Subjective/Objective Assessment:   53 yr old female s/p  removal of hardware right tibia.     Action/Plan:   Case manager spoke to patient concerning need for home health PT. CM contacted Tamala BariMary Manley, [facility] with referral. patient states she has a rolling walker and family assistance at discharge.   Anticipated DC Date:  01/21/2014   Anticipated DC Plan:  HOME W HOME HEALTH SERVICES      DC Planning Services  CM consult      [facility]   Choice offered to / List presented to:  C-1 Patient   DME arranged  NA        HH arranged  HH-2 PT      HH agency  [facility]   Status of service:  Completed, signed off Medicare Important Message given?   (If response is "NO", the following Medicare IM given date fields will be blank) Date Medicare IM given:   Medicare IM given by:   Date Additional Medicare IM given:   Additional Medicare IM given by:    Discharge Disposition:  HOME W HOME HEALTH SERVICES  Per UR Regulation:  Reviewed for med. necessity/level of care/duration of stay  If discussed at Long Length of Stay Meetings, dates discussed:    Comments:

## 2014-01-21 NOTE — Op Note (Signed)
NAMPeyton [name]:  [place], R[place]a                ACCOUNT NO.:  [id number]  MEDICAL RECORD NO.:  [id number]  LOCATION:  [id number]                        FACILITY:  [facility]  PHYSICIAN:  Doralee AlbinoMichael H. Carola FrostHandy, M.D. DATE OF BIRTH:  [birth year]  DATE OF PROCEDURE:  01/20/2014 DATE OF DISCHARGE:                              OPERATIVE REPORT   PREOPERATIVE DIAGNOSIS:  Right tibia nonunion.  POSTOPERATIVE DIAGNOSIS:  Right tibia nonunion.  PROCEDURES: 1. Repair of tibial nonunion with plating, autografting, and infuse. 2. Removal of hardware, right tibia.  SURGEON:  Doralee AlbinoMichael H. Carola FrostHandy, M.D.  ASSISTANT:  Montez MoritaKeith Paul, PA-C.  ANESTHESIA:  General.  COMPLICATIONS:  None.  I/O:  2000 mL crystalloid/UOP 250 mL.  ESTIMATED BLOOD LOSS:  100 mL.  SPECIMENS: 1. Reamings from the right tibia. 2. Micro.  TOURNIQUET:  None.  DISPOSITION:  To PACU.  CONDITION:  Stable.  BRIEF SUMMARY AND INDICATION FOR PROCEDURE:  [name] is a 53 year old female, who sustained a tibial plateau and comminuted proximal tibia and shaft fracture.  The patient then managed symptomatically for delayed union and eventual nonunion of the right tibia after her percutaneous fixation and IM nailing.  CT scan demonstrated areas of persistent nonunion.  I discussed with the patient, the risks and benefits of hardware removal, and exposure of the nonunion site and then grafting.  We discussed both autografting from the intramedullary reaming as well as using infuse allograft as an adjunct.  These risks included nerve injury, vessel injury, DVT, PE, heart attack, stroke, failure to achieve union, symptomatic hardware of the new implants, loss of motion, and many others.  The patient acknowledged these risk and did wish to proceed with nonunion repair.  BRIEF DESCRIPTION OF PROCEDURE:  [name] was given preoperative antibiotics and then taken operating room where general anesthesia was induced.  Her right lower extremity was prepped  and draped in usual sterile fashion.  No tourniquet was used during the procedure.  We began with making a multiple separate incisions for withdrawal of the locking bolts and [employer]ynthe[employer] tibial nail and also medially I had to expose the percutaneous fixation of the medial plateau, which has been placed anterior-posterior.  C-arm was used throughout to assist with identification and removal of the screws and bolts.  The anterior knee incision and deep retinacular incision was then made to engage the tip of the tibial nail and withdrawal.  My assistant, Montez MoritaKeith Paul assisted throughout such that he could instrument while I stabilized the tibia or facilitate exposure for identification and removal.  I then reamed up to 11 mm and passed off the reamings for microanalysis.  Attention was then turned to the tibia.  A separate curvilinear incision was made extending down to the anterior compartment.  The nonunion site was exposed using a #15 blade for identification and then a curette to scratch out all the fibrous tissue in between the bone fragments.  After we got back to healthy bleeding bone surfaces, a long proximal tibial plate was contoured to allow for proper placement because of the bony callus that had formed anterior to the nonunion site.  This plate was fixed proximally with standard screws and distally with standard  screws and then locked screws were added.  Prior to placing all the screws proximally, I then took the guidewire once more and placed it within the tibia.  We heard from the [facility] that there was no evidence of deep infection, and we then used additional reaming to facilitate autografting of the nonunion site from within the tibia.  We reamed up to a 12.  Attention was then turned to the exterior aspect of the tibia and again we placed the infuse allograft along the fracture site wedging it and securing it within the nonunion site, and a deep to the plate. The corner of  anterior compartment was reapproximated, but the compartment over 95% of it was left open.  Standard layered closure with 2-0 Vicryl and 3-0 nylon was performed.  The implant removal sites were closed at the with 0 Vicryl, 2-0 Vicryl, and 3-0 nylon and then 3-0 nylon distally, 2-0 and 3-0 medially.  Montez Morita, PA-C again assisted throughout and his assistance with the autografting and plating was required.  PROGNOSIS:  Ms. Stegmann will be partial weightbearing with unrestricted knee and ankle motion.  We anticipate a 2 day stay in the hospital. Because of some abnormal patterns of heart rate and metabolism during the procedure with regard anesthetic agents the anesthesiologist recommended tox screen to make sure that we can continue provide optimal care for the patient.  This has been performed and is pending.  She will be on DVT prophylaxis pharmacologically and likely transition to aspirin after discharged to home.     Doralee Albino. Carola Frost, M.D.     [facility]/MEDQ  D:  01/20/2014  T:  01/21/2014  Job:  [id number]

## 2014-01-21 NOTE — Evaluation (Signed)
Physical Therapy Evaluation Patient Details Name: [name] MRN: [id number] DOB: [birth year] Today's Date: 01/21/2014   History of Present Illness  53 y.o. female s/p repair of tibial nonunion with plating, autografting, and infuse.  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  Clinical Impression  Patient is seen following the above procedure and presents with functional limitations due to the deficits listed below (see PT Problem List). Post op note written by MD indicated patient to be partial weight bearing, however PA has since clarified and would like patient to be WBAT. She is transferring at a min guard level and ambulates up to 20 feet  however, has difficulty bearing weight through RLE. Patient reports she will have 24 hour care at home and therefore is adequate for d/c from a mobility standpoint. Will continue to follow until d/c, focusing on improving independence with functional mobility.    Follow Up Recommendations Home health PT;Supervision for mobility/OOB    Equipment Recommendations  3in1 (PT)    Recommendations for Other Services OT consult     Precautions / Restrictions Precautions Precautions: Fall Restrictions Weight Bearing Restrictions: Yes RLE Weight Bearing: Weight bearing as tolerated      Mobility  Bed Mobility Overal bed mobility: Modified Independent Bed Mobility: Supine to Sit     Supine to sit: Min assist     General bed mobility comments: Requires extra time. No physical assist  Transfers Overall transfer level: Needs assistance Equipment used: Rolling walker (2 wheeled) Transfers: Sit to/from [employer] Sit to Stand: Min guard Stand pivot transfers: Min guard       General transfer comment: Min guard for safety with VC for technique and hand placement. Some sway noteded initially but improved with practice. Practiced stand pivot transfer x 2 from bed and chair. VC to increase WB through RLE  as able.  Ambulation/Gait Ambulation/Gait assistance: Min guard Ambulation Distance (Feet): 25 Feet Assistive device: Rolling walker (2 wheeled) Gait Pattern/deviations: Step-to pattern;Decreased step length - left;Decreased stance time - right;Antalgic;Trunk flexed   Gait velocity interpretation: Below normal speed for age/gender General Gait Details: Educated on safe use of DME with rolling walker. VC for sequencing of gait. Much improved stability when able to bear weight through RLE.  Stairs            Wheelchair Mobility    Modified Rankin (Stroke Patients Only)       Balance Overall balance assessment: Needs assistance Sitting-balance support: No upper extremity supported;Feet supported Sitting balance-Leahy Scale: Good Sitting balance - Comments: prefers lean toward left   Standing balance support: No upper extremity supported Standing balance-Leahy Scale: Fair                               Pertinent Vitals/Pain Pt reports pain as "pretty bad" no numerical value given Nurse notified and administered pain medication during PT evaluation Patient repositioned in chair for comfort.     Home Living Family/patient expects to be discharged to:: Private residence Living Arrangements: Spouse/significant other;Children Available Help at Discharge: Family;Available 24 hours/day Type of Home: House Home Access: Level entry     Home Layout: One level Home Equipment: Walker - 2 wheels;Cane - single point;Bedside commode;Tub bench;Wheelchair - manual      Prior Function Level of Independence: Needs assistance   Gait / Transfers Assistance Needed: Used walker for ambulation  ADL's / Homemaking Assistance Needed: Daughter helped to bathe, dress  patient        Hand Dominance   Dominant Hand: Right    Extremity/Trunk Assessment   Upper Extremity Assessment: Defer to OT evaluation           Lower Extremity Assessment: RLE deficits/detail RLE  Deficits / Details: decreased strength and ROM as expected post op.    Cervical / Trunk Assessment: Normal  Communication   Communication: No difficulties  Cognition Arousal/Alertness: Awake/alert Behavior During Therapy: Impulsive Overall Cognitive Status: Within Functional Limits for tasks assessed                      General Comments General comments (skin integrity, edema, etc.): Educated on RLE elevation for swelling.    Exercises        Assessment/Plan    PT Assessment Patient needs continued PT services  PT Diagnosis Difficulty walking;Abnormality of gait;Acute pain   PT Problem List Decreased strength;Decreased range of motion;Decreased activity tolerance;Decreased balance;Decreased mobility;Decreased coordination;Decreased knowledge of use of DME;Decreased safety awareness;Pain  PT Treatment Interventions DME instruction;Gait training;Functional mobility training;Therapeutic activities;Therapeutic exercise;Balance training;Neuromuscular re-education;Patient/family education;Modalities   PT Goals (Current goals can be found in the Care Plan section) Acute Rehab PT Goals Patient Stated Goal: Walk PT Goal Formulation: With patient Time For Goal Achievement: 01/28/14 Potential to Achieve Goals: Good    Frequency Min 5X/week   Barriers to discharge        Co-evaluation               End of Session   Activity Tolerance: Patient tolerated treatment well Patient left: in chair;with call bell/phone within reach;with family/visitor present;with chair alarm set Nurse Communication: Mobility status;Patient requests pain meds         Time: 1332-1403 PT Time Calculation (min): 31 min   Charges:   PT Evaluation $Initial PT Evaluation Tier I: 1 Procedure PT Treatments $Therapeutic Activity: 8-22 mins   PT G Codes:         [name] MerlesLogan [name] [name], [name] [phone]  Berton MountBarbour, Kathee Tumlin S 01/21/2014, 3:07 PM

## 2014-01-22 ENCOUNTER — Encounter (HOSPITAL_COMMUNITY): Payer: Self-pay | Admitting: Orthopedic Surgery

## 2014-01-22 DIAGNOSIS — F329 Major depressive disorder, single episode, unspecified: Secondary | ICD-10-CM | POA: Diagnosis present

## 2014-01-22 DIAGNOSIS — E039 Hypothyroidism, unspecified: Secondary | ICD-10-CM | POA: Diagnosis present

## 2014-01-22 DIAGNOSIS — J45909 Unspecified asthma, uncomplicated: Secondary | ICD-10-CM | POA: Diagnosis present

## 2014-01-22 DIAGNOSIS — F32A Depression, unspecified: Secondary | ICD-10-CM | POA: Diagnosis present

## 2014-01-22 DIAGNOSIS — E213 Hyperparathyroidism, unspecified: Secondary | ICD-10-CM

## 2014-01-22 DIAGNOSIS — E559 Vitamin D deficiency, unspecified: Secondary | ICD-10-CM

## 2014-01-22 HISTORY — DX: Vitamin D deficiency, unspecified: E55.9

## 2014-01-22 HISTORY — DX: Hyperparathyroidism, unspecified: E21.3

## 2014-01-22 LAB — TSH: TSH: 1.75 u[IU]/mL (ref 0.350–4.500)

## 2014-01-22 MED ORDER — OXYCODONE HCL 5 MG PO TABS: 5.0000 mg | ORAL_TABLET | Freq: Four times a day (QID) | ORAL | Status: DC | PRN

## 2014-01-22 MED ORDER — METHOCARBAMOL 500 MG PO TABS: 500.0000 mg | ORAL_TABLET | Freq: Four times a day (QID) | ORAL | Status: AC | PRN

## 2014-01-22 MED ORDER — ENOXAPARIN SODIUM 40 MG/0.4ML ~~LOC~~ SOLN: 40.0000 mg | SUBCUTANEOUS | Status: DC

## 2014-01-22 MED ORDER — VITAMIN D (ERGOCALCIFEROL) 1.25 MG (50000 UNIT) PO CAPS: 50000.0000 [IU] | ORAL_CAPSULE | ORAL | Status: DC

## 2014-01-22 MED ORDER — OXYCODONE-ACETAMINOPHEN 5-325 MG PO TABS: 1.0000 | ORAL_TABLET | Freq: Four times a day (QID) | ORAL | Status: DC | PRN

## 2014-01-22 NOTE — Discharge Instructions (Signed)
[facility] Discharge Instructions   General Discharge Instructions  WEIGHT BEARING STATUS: Weightbear as tolerated R leg  RANGE OF MOTION/ACTIVITY: range of motion as tolerated R knee and ankle  Wound care: daily dressing changes starting on 01/24/2014. See instructions below   Diet: as you were eating previously.  Can use over the counter stool softeners and bowel preparations, such as Miralax, to help with bowel movements.  Narcotics can be constipating.  Be sure to drink plenty of fluids  STOP SMOKING OR USING NICOTINE PRODUCTS!!!!  As discussed nicotine severely impairs your body's ability to heal surgical and traumatic wounds but also impairs bone healing.  Wounds and bone heal by forming microscopic blood vessels (angiogenesis) and nicotine is a vasoconstrictor (essentially, shrinks blood vessels).  Therefore, if vasoconstriction occurs to these microscopic blood vessels they essentially disappear and are unable to deliver necessary nutrients to the healing tissue.  This is one modifiable factor that you can do to dramatically increase your chances of healing your injury.    (This means no smoking, no nicotine gum, patches, etc)  DO NOT USE NONSTEROIDAL ANTI-INFLAMMATORY DRUGS (NSAID'S)  Using products such as Advil (ibuprofen), Aleve (naproxen), Motrin (ibuprofen) for additional pain control during fracture healing can delay and/or prevent the healing response.  If you would like to take over the counter (OTC) medication, Tylenol (acetaminophen) is ok.  However, some narcotic medications that are given for pain control contain acetaminophen as well. Therefore, you should not exceed more than 4000 mg of tylenol in a day if you do not have liver disease.  Also note that there are may OTC medicines, such as cold medicines and allergy medicines that my contain tylenol as well.  If you have any questions about medications and/or interactions please ask your doctor/PA or your  pharmacist.   PAIN MEDICATION USE AND EXPECTATIONS  You have likely been given narcotic medications to help control your pain.  After a traumatic event that results in an fracture (broken bone) with or without surgery, it is ok to use narcotic pain medications to help control one's pain.  We understand that everyone responds to pain differently and each individual patient will be evaluated on a regular basis for the continued need for narcotic medications. Ideally, narcotic medication use should last no more than 6-8 weeks (coinciding with fracture healing).   As a patient it is your responsibility as well to monitor narcotic medication use and report the amount and frequency you use these medications when you come to your office visit.   We would also advise that if you are using narcotic medications, you should take a dose prior to therapy to maximize you participation.  IF YOU ARE ON NARCOTIC MEDICATIONS IT IS NOT PERMISSIBLE TO OPERATE A MOTOR VEHICLE (MOTORCYCLE/CAR/TRUCK/MOPED) OR HEAVY MACHINERY DO NOT MIX NARCOTICS WITH OTHER CNS (CENTRAL NERVOUS SYSTEM) DEPRESSANTS SUCH AS ALCOHOL       ICE AND ELEVATE INJURED/OPERATIVE EXTREMITY  Using ice and elevating the injured extremity above your heart can help with swelling and pain control.  Icing in a pulsatile fashion, such as 20 minutes on and 20 minutes off, can be followed.    Do not place ice directly on skin. Make sure there is a barrier between to skin and the ice pack.    Using frozen items such as frozen peas works well as the conform nicely to the are that needs to be iced.  USE AN ACE WRAP OR TED HOSE FOR SWELLING CONTROL  In addition to icing and elevation, Ace wraps or TED hose are used to help limit and resolve swelling.  It is recommended to use Ace wraps or TED hose until you are informed to stop.    When using Ace Wraps start the wrapping distally (farthest away from the body) and wrap proximally (closer to the  body)   Example: If you had surgery on your leg or thing and you do not have a splint on, start the ace wrap at the toes and work your way up to the thigh        If you had surgery on your upper extremity and do not have a splint on, start the ace wrap at your fingers and work your way up to the upper arm  IF YOU ARE IN A SPLINT OR CAST DO NOT REMOVE IT FOR ANY REASON   If your splint gets wet for any reason please contact the office immediately. You may shower in your splint or cast as long as you keep it dry.  This can be done by wrapping in a cast cover or garbage back (or similar)  Do Not stick any thing down your splint or cast such as pencils, money, or hangers to try and scratch yourself with.  If you feel itchy take benadryl as prescribed on the bottle for itching  IF YOU ARE IN A CAM BOOT (BLACK BOOT)  You may remove boot periodically. Perform daily dressing changes as noted below.  Wash the liner of the boot regularly and wear a sock when wearing the boot. It is recommended that you sleep in the boot until told otherwise  CALL THE OFFICE WITH ANY QUESTIONS OR CONCERTS: [phone]     Discharge Pin Site Instructions  Dress pins daily with Kerlix roll starting on POD 2. Wrap the Kerlix so that it tamps the skin down around the pin-skin interface to prevent/limit motion of the skin relative to the pin.  (Pin-skin motion is the primary cause of pain and infection related to external fixator pin sites).  Remove any crust or coagulum that may obstruct drainage with a saline moistened gauze or soap and water.  After POD 3, if there is no discernable drainage on the pin site dressing, the interval for change can by increased to every other day.  You may shower with the fixator, cleaning all pin sites gently with soap and water.  If you have a surgical wound this needs to be completely dry and without drainage before showering.  The extremity can be lifted by the fixator to facilitate  wound care and transfers.  Notify the office/Doctor if you experience increasing drainage, redness, or pain from a pin site, or if you notice purulent (thick, snot-like) drainage.  Discharge Wound Care Instructions  Do NOT apply any ointments, solutions or lotions to pin sites or surgical wounds.  These prevent needed drainage and even though solutions like hydrogen peroxide kill bacteria, they also damage cells lining the pin sites that help fight infection.  Applying lotions or ointments can keep the wounds moist and can cause them to breakdown and open up as well. This can increase the risk for infection. When in doubt call the office.  Surgical incisions should be dressed daily.  If any drainage is noted, use one layer of adaptic, then gauze, Kerlix, and an ace wrap.  Once the incision is completely dry and without drainage, it may be left open to air out.  Showering may begin 36-48  hours later.  Cleaning gently with soap and water.  Traumatic wounds should be dressed daily as well.    One layer of adaptic, gauze, Kerlix, then ace wrap.  The adaptic can be discontinued once the draining has ceased    If you have a wet to dry dressing: wet the gauze with saline the squeeze as much saline out so the gauze is moist (not soaking wet), place moistened gauze over wound, then place a dry gauze over the moist one, followed by Kerlix wrap, then ace wrap.

## 2014-01-22 NOTE — Progress Notes (Signed)
Physical Therapy Treatment Patient Details Name: [name] MRN: [id number] DOB: [birth year] Today'[name] Date: 01/22/2014    History of Present Illness 53 y.o. 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    PT Comments    Patient is progressing well towards physical therapy goals, ambulating up to 150 feet with rolling walker at a min guard level. Slowly increasing WB through RLE. Adequate for d/c from PT standpoint as all education has been reviewed and patient will have 24 hour care at home. Patient will continue to benefit from skilled physical therapy services at home with HHPT to further improve independence with functional mobility.    Follow Up Recommendations  Home health PT;Supervision for mobility/OOB     Equipment Recommendations  3in1 (PT)    Recommendations for Other Services OT consult     Precautions / Restrictions Precautions Precautions: Fall Restrictions Weight Bearing Restrictions: Yes RLE Weight Bearing: Weight bearing as tolerated    Mobility  Bed Mobility                  Transfers Overall transfer level: Needs assistance Equipment used: Rolling walker (2 wheeled) Transfers: Sit to/from Stand Sit to Stand: Min guard         General transfer comment: Min guard for safety with VC for hand placement. Minimal balance loss upon standing, able to self-correct by holding RW. Performed from recliner  Ambulation/Gait Ambulation/Gait assistance: Min guard Ambulation Distance (Feet): 100 Feet Assistive device: Rolling walker (2 wheeled) Gait Pattern/deviations: Step-to pattern;Decreased step length - left;Decreased stance time - right;Trunk flexed   Gait velocity interpretation: Below normal speed for age/gender General Gait Details: VC for upright posture and right glute activation to prevent trunk flexion. Slowly increasing WB tolerance through  RLE. VC for walker placement. No loss of balance noted   Stairs            Wheelchair Mobility    Modified Rankin (Stroke Patients Only)       Balance                                    Cognition Arousal/Alertness: Awake/alert Behavior During Therapy: WFL for tasks assessed/performed Overall Cognitive Status: Within Functional Limits for tasks assessed                      Exercises Total Joint Exercises Ankle Circles/Pumps: AROM;Both;5 reps;Seated    General Comments        Pertinent Vitals/Pain Pt with no complaints of pain during therapy session Patient repositioned in chair for comfort.     Home Living                      Prior Function            PT Goals (current goals can now be found in the care plan section) Acute Rehab PT Goals PT Goal Formulation: With patient Time For Goal Achievement: 01/28/14 Potential to Achieve Goals: Good Progress towards PT goals: Progressing toward goals    Frequency  Min 5X/week    PT Plan Current plan remains appropriate    Co-evaluation             End of Session   Activity Tolerance: Patient tolerated treatment well Patient left: in chair;with call bell/phone within reach;with family/visitor present  Time: [id number] PT Time Calculation (min): 16 min  Charges:  $Gait Training: 8-22 mins                    G Codes:      [name], [name] [phone]  [name] [place], [name] 01/22/2014, 12:09 PM
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
## 2014-01-22 NOTE — Progress Notes (Signed)
[facility] Progress Note  Subjective  Doing well No new issues Ready to go home Pain controlled Tolerating diet  Voiding w/o difficulty    Review of Systems  Constitutional: Negative for fever and chills.  Eyes: Negative for blurred vision.  Respiratory: Negative for shortness of breath and wheezing.   Cardiovascular: Negative for chest pain and palpitations.  Gastrointestinal: Negative for nausea, vomiting and abdominal pain.  Genitourinary: Negative for dysuria.  Musculoskeletal:       R leg pain   Neurological: Negative for tingling and sensory change.     Objective   BP 124/56  Pulse 82  Temp(Src) 98.9 F (37.2 C) (Oral)  Resp 18  Wt 75.297 kg (166 lb)  SpO2 95%  Intake/Output     07/15 0701 - 07/16 0700 07/16 0701 - 07/17 0700   P.O. 840    I.V. (mL/kg)     Total Intake(mL/kg) 840 (11.2)    Urine (mL/kg/hr) 250 (0.1)    Blood     Total Output 250     Net +590          Urine Occurrence 5 x      Labs  Results for [name], Charlise (MRN [id number]) as of 01/22/2014 08:51  Ref. Range 01/19/2014 14:43  CRP Latest Range: <0.60 mg/dL <4.0<0.5 (L)  Vit D, 98-[id number]-Hydroxy Latest Range: 30-89 ng/mL 22 (L)  Vitamin D 1, 25 (OH) Total Latest Range: 18-72 pg/mL 73 (H)  Vitamin D2 1, 25 (OH) No range found <8  Vitamin D3 1, 25 (OH) No range found 73  Results for [name], Atarah (MRN [id number]) as of 01/22/2014 08:51  Ref. Range 01/20/2014 06:45  Calcium, Total (PTH) Latest Range: 8.4-10.5 mg/dL 9.2  Results for [name], Yemaya (MRN [id number]) as of 01/22/2014 08:51  Ref. Range 01/19/2014 14:43  Albumin Latest Range: 3.5-5.2 g/dL 3.8   Results for [name], Karmel (MRN [id number]) as of 01/22/2014 08:51  Ref. Range 01/20/2014 06:45  PTH Latest Range: 14.0-72.0 pg/mL 78.0 (H)   TSH- pending   Exam  Gen: awake and alert, resting comfortably in bed, NAD Lungs: clear B  Cardiac: RRR, s1 and s2 Abd: +BS, soft, NTND Ext:       Right Lower Extremity   Dressing  removed  Wounds look fantastic, scant serosanguinous drainage  + swelling R lower leg  DPN, SPN, TN sensation intact  EHL, FHL, AT, PT, peroneals, gastroc motor intact  + DP pulse  Compartments soft    Assessment and Plan   POD/HD#: 2   1. 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 Pain management:             Continue with current regimen  3. ABL anemia/Hemodynamics             Stable  4. Medical issues  Vitamin D insufficiency- vitamin D2 50,000 IUs weekly x 8 weeks             Asthma/depression- home meds             Hypothyroidism- home meds  5. DVT/PE prophylaxis:             Lovenox             scds             Mobilize  6. ID:               Completed periop abx  7. Metabolic Bone Disease:           hyperparathroidism- likely secondary related to vitamin D deficiency    Will replace vitamin D, recheck labs in 8 weeks    Calcium levels normal              Nicotine negative   8. Activity:             WBAT R leg             ROM as tolerated R knee and ankle  9. FEN/Foley/Lines:             Advance diet as tolerated               Ok to NSL iv  10. Impediments to fracture healing:             Vitamin d deficiency               Thyroid dysfunction  Hyperparathyroidism    11. Dispo:            dc home today   Follow up in 10-14 days    Mearl Latin, PA-C [facility] [phone] (P) 01/22/2014 8:50 AM  **Disclaimer: This note may have been dictated with voice recognition software. Similar sounding words can inadvertently be transcribed and this note may contain transcription errors which may not have been corrected upon publication of note.**

## 2014-01-23 LAB — TISSUE CULTURE: Culture: NO GROWTH

## 2014-01-25 LAB — ANAEROBIC CULTURE

## 2014-01-26 ENCOUNTER — Encounter (HOSPITAL_COMMUNITY): Payer: Self-pay | Admitting: Orthopedic Surgery

## 2014-02-04 ENCOUNTER — Encounter (HOSPITAL_COMMUNITY): Payer: Self-pay | Admitting: Orthopedic Surgery

## 2014-02-12 ENCOUNTER — Ambulatory Visit: Payer: No Typology Code available for payment source | Admitting: Rehabilitation

## 2014-02-18 ENCOUNTER — Ambulatory Visit: Payer: No Typology Code available for payment source | Admitting: Rehabilitation

## 2014-03-09 ENCOUNTER — Ambulatory Visit: Payer: No Typology Code available for payment source | Admitting: Rehabilitation

## 2014-03-19 ENCOUNTER — Ambulatory Visit: Payer: No Typology Code available for payment source | Admitting: Physical Therapy

## 2014-04-06 ENCOUNTER — Ambulatory Visit: Payer: Medicaid Other | Attending: Orthopedic Surgery | Admitting: Physical Therapy

## 2014-04-09 ENCOUNTER — Ambulatory Visit: Payer: Medicaid Other | Attending: Orthopedic Surgery | Admitting: Physical Therapy

## 2014-04-09 DIAGNOSIS — R269 Unspecified abnormalities of gait and mobility: Secondary | ICD-10-CM | POA: Insufficient documentation

## 2014-04-09 DIAGNOSIS — M25561 Pain in right knee: Secondary | ICD-10-CM | POA: Insufficient documentation

## 2014-04-09 DIAGNOSIS — S82201D Unspecified fracture of shaft of right tibia, subsequent encounter for closed fracture with routine healing: Secondary | ICD-10-CM | POA: Insufficient documentation

## 2014-04-09 DIAGNOSIS — M6281 Muscle weakness (generalized): Secondary | ICD-10-CM | POA: Insufficient documentation

## 2014-04-09 DIAGNOSIS — Z9889 Other specified postprocedural states: Secondary | ICD-10-CM | POA: Diagnosis not present

## 2014-04-13 ENCOUNTER — Ambulatory Visit: Payer: Medicaid Other | Admitting: Physical Therapy

## 2014-04-16 ENCOUNTER — Ambulatory Visit: Payer: Medicaid Other | Admitting: Rehabilitation

## 2014-04-20 ENCOUNTER — Ambulatory Visit: Payer: Medicaid Other | Admitting: Rehabilitation

## 2014-04-20 DIAGNOSIS — M25561 Pain in right knee: Secondary | ICD-10-CM | POA: Diagnosis not present

## 2014-04-27 ENCOUNTER — Ambulatory Visit: Payer: Medicaid Other | Admitting: Rehabilitation

## 2014-04-27 DIAGNOSIS — M25561 Pain in right knee: Secondary | ICD-10-CM | POA: Diagnosis not present

## 2014-04-30 ENCOUNTER — Ambulatory Visit: Payer: Medicaid Other | Admitting: Physical Therapy

## 2014-05-05 ENCOUNTER — Ambulatory Visit: Payer: Medicaid Other | Admitting: Physical Therapy

## 2014-05-05 DIAGNOSIS — M25561 Pain in right knee: Secondary | ICD-10-CM | POA: Diagnosis not present

## 2014-05-07 ENCOUNTER — Ambulatory Visit: Payer: Medicaid Other | Admitting: Rehabilitation

## 2014-05-07 DIAGNOSIS — M25561 Pain in right knee: Secondary | ICD-10-CM | POA: Diagnosis not present

## 2014-05-11 ENCOUNTER — Ambulatory Visit: Payer: Medicaid Other | Attending: Orthopedic Surgery | Admitting: Rehabilitation

## 2014-05-11 DIAGNOSIS — R269 Unspecified abnormalities of gait and mobility: Secondary | ICD-10-CM | POA: Insufficient documentation

## 2014-05-11 DIAGNOSIS — Z9889 Other specified postprocedural states: Secondary | ICD-10-CM | POA: Diagnosis not present

## 2014-05-11 DIAGNOSIS — M25561 Pain in right knee: Secondary | ICD-10-CM | POA: Insufficient documentation

## 2014-05-11 DIAGNOSIS — M6281 Muscle weakness (generalized): Secondary | ICD-10-CM | POA: Insufficient documentation

## 2014-05-11 DIAGNOSIS — S82201D Unspecified fracture of shaft of right tibia, subsequent encounter for closed fracture with routine healing: Secondary | ICD-10-CM | POA: Insufficient documentation

## 2014-05-14 ENCOUNTER — Ambulatory Visit: Payer: Medicaid Other | Admitting: Rehabilitation

## 2014-05-18 ENCOUNTER — Ambulatory Visit: Payer: Medicaid Other | Admitting: Rehabilitation

## 2014-05-20 ENCOUNTER — Ambulatory Visit: Payer: Medicaid Other | Admitting: Physical Therapy

## 2014-05-20 DIAGNOSIS — M25561 Pain in right knee: Secondary | ICD-10-CM | POA: Diagnosis not present

## 2014-05-25 ENCOUNTER — Ambulatory Visit: Payer: Medicaid Other | Admitting: Rehabilitation

## 2014-05-25 DIAGNOSIS — M25561 Pain in right knee: Secondary | ICD-10-CM | POA: Diagnosis not present

## 2014-05-26 ENCOUNTER — Ambulatory Visit: Payer: Medicaid Other | Admitting: Physical Therapy

## 2014-05-26 DIAGNOSIS — M25561 Pain in right knee: Secondary | ICD-10-CM | POA: Diagnosis not present

## 2014-06-01 ENCOUNTER — Ambulatory Visit: Payer: Medicaid Other | Admitting: Rehabilitation

## 2014-06-03 ENCOUNTER — Ambulatory Visit: Payer: Medicaid Other | Admitting: Physical Therapy

## 2014-06-08 ENCOUNTER — Ambulatory Visit: Payer: Medicaid Other | Admitting: Rehabilitation

## 2014-06-11 ENCOUNTER — Ambulatory Visit: Payer: Medicaid Other | Attending: Orthopedic Surgery | Admitting: Physical Therapy

## 2014-06-11 DIAGNOSIS — R269 Unspecified abnormalities of gait and mobility: Secondary | ICD-10-CM | POA: Insufficient documentation

## 2014-06-11 DIAGNOSIS — M6281 Muscle weakness (generalized): Secondary | ICD-10-CM | POA: Diagnosis not present

## 2014-06-11 DIAGNOSIS — M25561 Pain in right knee: Secondary | ICD-10-CM | POA: Diagnosis not present

## 2014-06-11 DIAGNOSIS — S82201D Unspecified fracture of shaft of right tibia, subsequent encounter for closed fracture with routine healing: Secondary | ICD-10-CM | POA: Diagnosis not present

## 2014-06-11 DIAGNOSIS — Z9889 Other specified postprocedural states: Secondary | ICD-10-CM | POA: Diagnosis not present

## 2014-06-15 ENCOUNTER — Ambulatory Visit: Payer: Medicaid Other | Admitting: Rehabilitation

## 2014-06-18 ENCOUNTER — Ambulatory Visit: Payer: Medicaid Other | Admitting: Physical Therapy

## 2014-06-18 DIAGNOSIS — M25561 Pain in right knee: Secondary | ICD-10-CM | POA: Diagnosis not present

## 2014-06-22 ENCOUNTER — Ambulatory Visit: Payer: Medicaid Other | Admitting: Rehabilitation

## 2014-06-25 ENCOUNTER — Ambulatory Visit: Payer: Medicaid Other | Admitting: Physical Therapy

## 2014-06-29 ENCOUNTER — Ambulatory Visit: Payer: Medicaid Other | Admitting: Rehabilitation

## 2014-07-01 ENCOUNTER — Ambulatory Visit: Payer: Medicaid Other

## 2014-08-05 ENCOUNTER — Ambulatory Visit: Payer: No Typology Code available for payment source

## 2014-09-23 ENCOUNTER — Ambulatory Visit: Payer: No Typology Code available for payment source | Admitting: Physical Therapy

## 2014-10-06 ENCOUNTER — Ambulatory Visit: Payer: No Typology Code available for payment source | Admitting: Physical Therapy

## 2014-10-20 ENCOUNTER — Ambulatory Visit: Payer: Medicaid Other | Attending: Orthopedic Surgery | Admitting: Physical Therapy

## 2014-10-20 ENCOUNTER — Encounter: Payer: Self-pay | Admitting: Physical Therapy

## 2014-10-20 DIAGNOSIS — R262 Difficulty in walking, not elsewhere classified: Secondary | ICD-10-CM | POA: Diagnosis not present

## 2014-10-20 DIAGNOSIS — M79604 Pain in right leg: Secondary | ICD-10-CM | POA: Diagnosis present

## 2014-10-20 DIAGNOSIS — M25561 Pain in right knee: Secondary | ICD-10-CM | POA: Insufficient documentation

## 2014-10-20 DIAGNOSIS — M259 Joint disorder, unspecified: Secondary | ICD-10-CM | POA: Diagnosis not present

## 2014-10-20 DIAGNOSIS — R29898 Other symptoms and signs involving the musculoskeletal system: Secondary | ICD-10-CM
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
## 2014-10-28 ENCOUNTER — Ambulatory Visit: Payer: Medicaid Other | Admitting: Rehabilitation

## 2014-10-28 DIAGNOSIS — M79604 Pain in right leg: Secondary | ICD-10-CM | POA: Diagnosis not present

## 2014-10-28 DIAGNOSIS — M25561 Pain in right knee: Secondary | ICD-10-CM

## 2014-10-28 DIAGNOSIS — R29898 Other symptoms and signs involving the musculoskeletal system: Secondary | ICD-10-CM

## 2014-10-28 DIAGNOSIS — R262 Difficulty in walking, not elsewhere classified: Secondary | ICD-10-CM
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
## 2014-11-03 ENCOUNTER — Ambulatory Visit: Payer: Medicaid Other | Admitting: Physical Therapy

## 2014-11-04 ENCOUNTER — Ambulatory Visit: Payer: Medicaid Other | Admitting: Rehabilitation

## 2014-11-04 DIAGNOSIS — M79604 Pain in right leg: Secondary | ICD-10-CM

## 2014-11-04 DIAGNOSIS — M25561 Pain in right knee: Secondary | ICD-10-CM

## 2014-11-04 DIAGNOSIS — R262 Difficulty in walking, not elsewhere classified: Secondary | ICD-10-CM

## 2014-11-04 DIAGNOSIS — R29898 Other symptoms and signs involving the musculoskeletal system: Secondary | ICD-10-CM
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
## 2014-11-10 ENCOUNTER — Ambulatory Visit: Payer: Medicaid Other | Attending: Orthopedic Surgery | Admitting: Rehabilitation

## 2014-11-10 DIAGNOSIS — M259 Joint disorder, unspecified: Secondary | ICD-10-CM | POA: Diagnosis not present

## 2014-11-10 DIAGNOSIS — M25561 Pain in right knee: Secondary | ICD-10-CM | POA: Diagnosis not present

## 2014-11-10 DIAGNOSIS — M79604 Pain in right leg: Secondary | ICD-10-CM | POA: Insufficient documentation

## 2014-11-10 DIAGNOSIS — R262 Difficulty in walking, not elsewhere classified: Secondary | ICD-10-CM | POA: Insufficient documentation

## 2014-11-10 DIAGNOSIS — R29898 Other symptoms and signs involving the musculoskeletal system: Secondary | ICD-10-CM
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
## 2014-11-16 ENCOUNTER — Ambulatory Visit: Payer: Medicaid Other | Admitting: Rehabilitation

## 2014-11-16 DIAGNOSIS — M79604 Pain in right leg: Secondary | ICD-10-CM | POA: Diagnosis not present

## 2014-11-16 DIAGNOSIS — M25561 Pain in right knee: Secondary | ICD-10-CM

## 2014-11-16 DIAGNOSIS — R29898 Other symptoms and signs involving the musculoskeletal system: Secondary | ICD-10-CM

## 2014-11-16 DIAGNOSIS — R262 Difficulty in walking, not elsewhere classified: Secondary | ICD-10-CM
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
## 2014-11-19 ENCOUNTER — Ambulatory Visit: Payer: Medicaid Other | Admitting: Physical Therapy

## 2014-11-19 DIAGNOSIS — M25561 Pain in right knee: Secondary | ICD-10-CM

## 2014-11-19 DIAGNOSIS — M79604 Pain in right leg: Secondary | ICD-10-CM | POA: Diagnosis not present

## 2014-11-19 DIAGNOSIS — R262 Difficulty in walking, not elsewhere classified: Secondary | ICD-10-CM

## 2014-11-19 DIAGNOSIS — R29898 Other symptoms and signs involving the musculoskeletal system: Secondary | ICD-10-CM
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
## 2014-11-24 ENCOUNTER — Ambulatory Visit: Payer: Medicaid Other | Admitting: Physical Therapy

## 2014-11-24 DIAGNOSIS — M25561 Pain in right knee: Secondary | ICD-10-CM

## 2014-11-24 DIAGNOSIS — M79604 Pain in right leg: Secondary | ICD-10-CM

## 2014-11-24 DIAGNOSIS — R29898 Other symptoms and signs involving the musculoskeletal system: Secondary | ICD-10-CM

## 2014-11-24 DIAGNOSIS — R262 Difficulty in walking, not elsewhere classified: Secondary | ICD-10-CM
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
## 2014-11-26 ENCOUNTER — Ambulatory Visit: Payer: Medicaid Other | Admitting: Physical Therapy

## 2014-11-26 DIAGNOSIS — R29898 Other symptoms and signs involving the musculoskeletal system: Secondary | ICD-10-CM

## 2014-11-26 DIAGNOSIS — M25561 Pain in right knee: Secondary | ICD-10-CM

## 2014-11-26 DIAGNOSIS — M79604 Pain in right leg: Secondary | ICD-10-CM

## 2014-11-26 DIAGNOSIS — R262 Difficulty in walking, not elsewhere classified: Secondary | ICD-10-CM
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
## 2014-12-01 ENCOUNTER — Ambulatory Visit: Payer: Medicaid Other | Admitting: Rehabilitation

## 2014-12-01 DIAGNOSIS — M79604 Pain in right leg: Secondary | ICD-10-CM | POA: Diagnosis not present

## 2014-12-01 DIAGNOSIS — M25561 Pain in right knee: Secondary | ICD-10-CM

## 2014-12-01 DIAGNOSIS — R262 Difficulty in walking, not elsewhere classified: Secondary | ICD-10-CM

## 2014-12-01 DIAGNOSIS — R29898 Other symptoms and signs involving the musculoskeletal system: Secondary | ICD-10-CM
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
## 2014-12-03 ENCOUNTER — Ambulatory Visit: Payer: Medicaid Other | Admitting: Physical Therapy

## 2014-12-03 DIAGNOSIS — M79604 Pain in right leg: Secondary | ICD-10-CM | POA: Diagnosis not present

## 2014-12-03 DIAGNOSIS — R262 Difficulty in walking, not elsewhere classified: Secondary | ICD-10-CM

## 2014-12-03 DIAGNOSIS — M25561 Pain in right knee: Secondary | ICD-10-CM
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
## 2014-12-09 ENCOUNTER — Ambulatory Visit: Payer: Medicaid Other | Attending: Orthopedic Surgery | Admitting: Rehabilitation

## 2014-12-09 DIAGNOSIS — R262 Difficulty in walking, not elsewhere classified: Secondary | ICD-10-CM

## 2014-12-09 DIAGNOSIS — M79604 Pain in right leg: Secondary | ICD-10-CM | POA: Diagnosis not present

## 2014-12-09 DIAGNOSIS — R29898 Other symptoms and signs involving the musculoskeletal system: Secondary | ICD-10-CM

## 2014-12-09 DIAGNOSIS — M259 Joint disorder, unspecified: Secondary | ICD-10-CM | POA: Insufficient documentation

## 2014-12-09 DIAGNOSIS — M25561 Pain in right knee: Secondary | ICD-10-CM | POA: Diagnosis present
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
## 2014-12-10 ENCOUNTER — Ambulatory Visit: Payer: Medicaid Other | Admitting: Rehabilitation

## 2014-12-15 ENCOUNTER — Ambulatory Visit: Payer: Medicaid Other | Admitting: Physical Therapy

## 2014-12-17 ENCOUNTER — Ambulatory Visit: Payer: Medicaid Other | Admitting: Rehabilitation

## 2014-12-17 DIAGNOSIS — M25561 Pain in right knee: Secondary | ICD-10-CM

## 2014-12-17 DIAGNOSIS — R29898 Other symptoms and signs involving the musculoskeletal system: Secondary | ICD-10-CM

## 2014-12-17 DIAGNOSIS — M79604 Pain in right leg: Secondary | ICD-10-CM

## 2014-12-17 DIAGNOSIS — R262 Difficulty in walking, not elsewhere classified: Secondary | ICD-10-CM
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
## 2014-12-22 ENCOUNTER — Ambulatory Visit: Payer: Medicaid Other | Admitting: Physical Therapy

## 2014-12-22 DIAGNOSIS — M25561 Pain in right knee: Secondary | ICD-10-CM

## 2014-12-22 DIAGNOSIS — R262 Difficulty in walking, not elsewhere classified: Secondary | ICD-10-CM

## 2014-12-22 DIAGNOSIS — M79604 Pain in right leg: Secondary | ICD-10-CM | POA: Diagnosis not present
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
## 2014-12-24 ENCOUNTER — Ambulatory Visit: Payer: Medicaid Other | Admitting: Rehabilitation

## 2014-12-29 ENCOUNTER — Ambulatory Visit: Payer: Medicaid Other | Admitting: Physical Therapy

## 2014-12-31 ENCOUNTER — Encounter: Payer: Medicaid Other | Admitting: Rehabilitation

## 2015-01-13 ENCOUNTER — Ambulatory Visit: Payer: Medicaid Other | Attending: Orthopedic Surgery | Admitting: Rehabilitation

## 2015-01-13 DIAGNOSIS — R262 Difficulty in walking, not elsewhere classified: Secondary | ICD-10-CM | POA: Insufficient documentation

## 2015-01-13 DIAGNOSIS — M79604 Pain in right leg: Secondary | ICD-10-CM | POA: Diagnosis present

## 2015-01-13 DIAGNOSIS — M25561 Pain in right knee: Secondary | ICD-10-CM | POA: Insufficient documentation

## 2015-01-13 DIAGNOSIS — R29898 Other symptoms and signs involving the musculoskeletal system: Secondary | ICD-10-CM

## 2015-01-13 DIAGNOSIS — M259 Joint disorder, unspecified: Secondary | ICD-10-CM | POA: Diagnosis present
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
## 2015-01-18 ENCOUNTER — Ambulatory Visit: Payer: Medicaid Other | Admitting: Rehabilitation

## 2015-01-18 DIAGNOSIS — M79604 Pain in right leg: Secondary | ICD-10-CM | POA: Diagnosis not present

## 2015-01-18 DIAGNOSIS — R262 Difficulty in walking, not elsewhere classified: Secondary | ICD-10-CM

## 2015-01-18 DIAGNOSIS — R29898 Other symptoms and signs involving the musculoskeletal system: Secondary | ICD-10-CM

## 2015-01-18 DIAGNOSIS — M25561 Pain in right knee: Secondary | ICD-10-CM
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
## 2015-01-21 ENCOUNTER — Ambulatory Visit: Payer: Medicaid Other | Admitting: Physical Therapy

## 2015-01-25 ENCOUNTER — Ambulatory Visit: Payer: Medicaid Other | Admitting: Rehabilitation

## 2015-01-25 DIAGNOSIS — R262 Difficulty in walking, not elsewhere classified: Secondary | ICD-10-CM

## 2015-01-25 DIAGNOSIS — M79604 Pain in right leg: Secondary | ICD-10-CM | POA: Diagnosis not present

## 2015-01-25 DIAGNOSIS — M25561 Pain in right knee: Secondary | ICD-10-CM

## 2015-01-25 DIAGNOSIS — R29898 Other symptoms and signs involving the musculoskeletal system: Secondary | ICD-10-CM
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
## 2015-02-08 ENCOUNTER — Ambulatory Visit: Payer: Medicaid Other | Attending: Orthopedic Surgery | Admitting: Physical Therapy

## 2015-02-15 ENCOUNTER — Ambulatory Visit: Payer: Medicaid Other | Admitting: Rehabilitation

## 2015-03-16 ENCOUNTER — Ambulatory Visit: Payer: Medicaid Other | Admitting: Physical Therapy

## 2015-03-16 ENCOUNTER — Ambulatory Visit: Payer: Medicaid Other | Attending: Orthopedic Surgery | Admitting: Physical Therapy

## 2015-03-16 DIAGNOSIS — M79604 Pain in right leg: Secondary | ICD-10-CM | POA: Diagnosis not present

## 2015-03-16 DIAGNOSIS — M259 Joint disorder, unspecified: Secondary | ICD-10-CM | POA: Diagnosis present

## 2015-03-16 DIAGNOSIS — M25561 Pain in right knee: Secondary | ICD-10-CM

## 2015-03-16 DIAGNOSIS — R29898 Other symptoms and signs involving the musculoskeletal system: Secondary | ICD-10-CM

## 2015-03-16 DIAGNOSIS — R262 Difficulty in walking, not elsewhere classified: Secondary | ICD-10-CM | POA: Diagnosis present
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
## 2015-03-23 ENCOUNTER — Ambulatory Visit: Payer: Medicaid Other | Admitting: Rehabilitation

## 2015-03-24 ENCOUNTER — Ambulatory Visit: Payer: Medicaid Other | Admitting: Physical Therapy

## 2015-03-24 DIAGNOSIS — R29898 Other symptoms and signs involving the musculoskeletal system: Secondary | ICD-10-CM

## 2015-03-24 DIAGNOSIS — R262 Difficulty in walking, not elsewhere classified: Secondary | ICD-10-CM

## 2015-03-24 DIAGNOSIS — M25561 Pain in right knee: Secondary | ICD-10-CM

## 2015-03-24 DIAGNOSIS — M79604 Pain in right leg: Secondary | ICD-10-CM
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
## 2015-03-30 ENCOUNTER — Ambulatory Visit: Payer: Medicaid Other | Admitting: Rehabilitation

## 2015-03-30 DIAGNOSIS — R29898 Other symptoms and signs involving the musculoskeletal system: Secondary | ICD-10-CM

## 2015-03-30 DIAGNOSIS — M79604 Pain in right leg: Secondary | ICD-10-CM

## 2015-03-30 DIAGNOSIS — R262 Difficulty in walking, not elsewhere classified: Secondary | ICD-10-CM

## 2015-03-30 DIAGNOSIS — M25561 Pain in right knee: Secondary | ICD-10-CM
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
## 2015-04-01 ENCOUNTER — Ambulatory Visit: Payer: Medicaid Other | Admitting: Physical Therapy

## 2015-04-01 DIAGNOSIS — R262 Difficulty in walking, not elsewhere classified: Secondary | ICD-10-CM

## 2015-04-01 DIAGNOSIS — M79604 Pain in right leg: Secondary | ICD-10-CM

## 2015-04-01 DIAGNOSIS — R29898 Other symptoms and signs involving the musculoskeletal system: Secondary | ICD-10-CM
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
## 2015-04-06 ENCOUNTER — Ambulatory Visit: Payer: Medicaid Other | Admitting: Physical Therapy

## 2015-04-08 ENCOUNTER — Ambulatory Visit: Payer: Medicaid Other | Admitting: Physical Therapy

## 2015-04-08 DIAGNOSIS — R29898 Other symptoms and signs involving the musculoskeletal system: Secondary | ICD-10-CM

## 2015-04-08 DIAGNOSIS — M25561 Pain in right knee: Secondary | ICD-10-CM

## 2015-04-08 DIAGNOSIS — R262 Difficulty in walking, not elsewhere classified: Secondary | ICD-10-CM

## 2015-04-08 DIAGNOSIS — M79604 Pain in right leg: Secondary | ICD-10-CM | POA: Diagnosis not present
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
## 2015-04-13 ENCOUNTER — Ambulatory Visit: Payer: Medicaid Other | Attending: Orthopedic Surgery | Admitting: Physical Therapy

## 2015-04-13 DIAGNOSIS — R262 Difficulty in walking, not elsewhere classified: Secondary | ICD-10-CM | POA: Insufficient documentation

## 2015-04-13 DIAGNOSIS — M79604 Pain in right leg: Secondary | ICD-10-CM | POA: Insufficient documentation

## 2015-04-13 DIAGNOSIS — M25561 Pain in right knee: Secondary | ICD-10-CM

## 2015-04-13 DIAGNOSIS — R29898 Other symptoms and signs involving the musculoskeletal system: Secondary | ICD-10-CM

## 2015-04-13 DIAGNOSIS — M259 Joint disorder, unspecified: Secondary | ICD-10-CM | POA: Insufficient documentation
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
## 2015-04-15 ENCOUNTER — Ambulatory Visit: Payer: Medicaid Other | Admitting: Physical Therapy

## 2015-04-15 DIAGNOSIS — M79604 Pain in right leg: Secondary | ICD-10-CM

## 2015-04-15 DIAGNOSIS — M25561 Pain in right knee: Secondary | ICD-10-CM

## 2015-04-15 DIAGNOSIS — R262 Difficulty in walking, not elsewhere classified: Secondary | ICD-10-CM

## 2015-04-15 DIAGNOSIS — R29898 Other symptoms and signs involving the musculoskeletal system: Secondary | ICD-10-CM
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
## 2015-04-20 ENCOUNTER — Ambulatory Visit: Payer: Medicaid Other | Admitting: Rehabilitation

## 2015-04-20 DIAGNOSIS — M25561 Pain in right knee: Secondary | ICD-10-CM

## 2015-04-20 DIAGNOSIS — M79604 Pain in right leg: Secondary | ICD-10-CM | POA: Diagnosis not present

## 2015-04-20 DIAGNOSIS — R262 Difficulty in walking, not elsewhere classified: Secondary | ICD-10-CM

## 2015-04-20 DIAGNOSIS — R29898 Other symptoms and signs involving the musculoskeletal system: Secondary | ICD-10-CM
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
## 2015-04-22 ENCOUNTER — Ambulatory Visit: Payer: Medicaid Other | Admitting: Physical Therapy

## 2015-04-22 DIAGNOSIS — R262 Difficulty in walking, not elsewhere classified: Secondary | ICD-10-CM

## 2015-04-22 DIAGNOSIS — R29898 Other symptoms and signs involving the musculoskeletal system: Secondary | ICD-10-CM

## 2015-04-22 DIAGNOSIS — M79604 Pain in right leg: Secondary | ICD-10-CM

## 2015-04-22 DIAGNOSIS — M25561 Pain in right knee: Secondary | ICD-10-CM
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
## 2015-04-29 ENCOUNTER — Ambulatory Visit: Payer: Medicaid Other | Admitting: Physical Therapy

## 2015-04-29 DIAGNOSIS — M79604 Pain in right leg: Secondary | ICD-10-CM

## 2015-04-29 DIAGNOSIS — M25561 Pain in right knee: Secondary | ICD-10-CM

## 2015-04-29 DIAGNOSIS — R262 Difficulty in walking, not elsewhere classified: Secondary | ICD-10-CM

## 2015-04-29 DIAGNOSIS — R29898 Other symptoms and signs involving the musculoskeletal system: Secondary | ICD-10-CM
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
## 2015-05-04 ENCOUNTER — Ambulatory Visit: Payer: Medicaid Other | Admitting: Physical Therapy

## 2015-05-06 ENCOUNTER — Ambulatory Visit: Payer: Medicaid Other | Admitting: Physical Therapy

## 2015-05-06 DIAGNOSIS — M25561 Pain in right knee: Secondary | ICD-10-CM

## 2015-05-06 DIAGNOSIS — M79604 Pain in right leg: Secondary | ICD-10-CM

## 2015-05-06 DIAGNOSIS — R262 Difficulty in walking, not elsewhere classified: Secondary | ICD-10-CM

## 2015-05-06 DIAGNOSIS — R29898 Other symptoms and signs involving the musculoskeletal system: Secondary | ICD-10-CM
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
## 2015-05-11 ENCOUNTER — Ambulatory Visit: Payer: Medicaid Other | Attending: Orthopedic Surgery | Admitting: Physical Therapy

## 2015-05-11 DIAGNOSIS — M25561 Pain in right knee: Secondary | ICD-10-CM | POA: Diagnosis present

## 2015-05-11 DIAGNOSIS — M79604 Pain in right leg: Secondary | ICD-10-CM | POA: Insufficient documentation

## 2015-05-11 DIAGNOSIS — M259 Joint disorder, unspecified: Secondary | ICD-10-CM | POA: Diagnosis present

## 2015-05-11 DIAGNOSIS — R29898 Other symptoms and signs involving the musculoskeletal system: Secondary | ICD-10-CM

## 2015-05-11 DIAGNOSIS — R262 Difficulty in walking, not elsewhere classified: Secondary | ICD-10-CM | POA: Diagnosis present
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
## 2015-05-13 ENCOUNTER — Ambulatory Visit: Payer: Medicaid Other | Admitting: Physical Therapy

## 2015-05-13 DIAGNOSIS — R29898 Other symptoms and signs involving the musculoskeletal system: Secondary | ICD-10-CM

## 2015-05-13 DIAGNOSIS — M79604 Pain in right leg: Secondary | ICD-10-CM

## 2015-05-13 DIAGNOSIS — R262 Difficulty in walking, not elsewhere classified: Secondary | ICD-10-CM

## 2015-05-13 DIAGNOSIS — M25561 Pain in right knee: Secondary | ICD-10-CM
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
## 2015-05-18 ENCOUNTER — Ambulatory Visit: Payer: Medicaid Other | Admitting: Physical Therapy

## 2015-05-18 DIAGNOSIS — M79604 Pain in right leg: Secondary | ICD-10-CM | POA: Diagnosis not present

## 2015-05-18 DIAGNOSIS — R262 Difficulty in walking, not elsewhere classified: Secondary | ICD-10-CM

## 2015-05-18 DIAGNOSIS — R29898 Other symptoms and signs involving the musculoskeletal system: Secondary | ICD-10-CM

## 2015-05-18 DIAGNOSIS — M25561 Pain in right knee: Secondary | ICD-10-CM
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
## 2015-05-20 ENCOUNTER — Ambulatory Visit: Payer: Medicaid Other | Admitting: Physical Therapy

## 2015-05-20 DIAGNOSIS — M25561 Pain in right knee: Secondary | ICD-10-CM

## 2015-05-20 DIAGNOSIS — M79604 Pain in right leg: Secondary | ICD-10-CM | POA: Diagnosis not present

## 2015-05-20 DIAGNOSIS — R262 Difficulty in walking, not elsewhere classified: Secondary | ICD-10-CM

## 2015-05-20 DIAGNOSIS — R29898 Other symptoms and signs involving the musculoskeletal system: Secondary | ICD-10-CM
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
## 2015-06-01 ENCOUNTER — Ambulatory Visit: Payer: Medicaid Other | Admitting: Physical Therapy

## 2015-06-01 DIAGNOSIS — M79604 Pain in right leg: Secondary | ICD-10-CM

## 2015-06-01 DIAGNOSIS — R262 Difficulty in walking, not elsewhere classified: Secondary | ICD-10-CM

## 2015-06-01 DIAGNOSIS — R29898 Other symptoms and signs involving the musculoskeletal system: Secondary | ICD-10-CM

## 2015-06-01 DIAGNOSIS — M25561 Pain in right knee: Secondary | ICD-10-CM
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
## 2015-06-08 ENCOUNTER — Ambulatory Visit: Payer: Medicaid Other | Admitting: Physical Therapy

## 2015-06-08 DIAGNOSIS — R29898 Other symptoms and signs involving the musculoskeletal system: Secondary | ICD-10-CM

## 2015-06-08 DIAGNOSIS — M79604 Pain in right leg: Secondary | ICD-10-CM | POA: Diagnosis not present

## 2015-06-08 DIAGNOSIS — R262 Difficulty in walking, not elsewhere classified: Secondary | ICD-10-CM

## 2015-06-08 DIAGNOSIS — M25561 Pain in right knee: Secondary | ICD-10-CM
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
## 2015-06-10 ENCOUNTER — Ambulatory Visit: Payer: Medicaid Other | Admitting: Physical Therapy

## 2015-06-15 ENCOUNTER — Ambulatory Visit: Payer: Medicaid Other | Admitting: Physical Therapy

## 2015-06-17 ENCOUNTER — Ambulatory Visit: Payer: Medicaid Other | Admitting: Physical Therapy

## 2015-06-22 ENCOUNTER — Ambulatory Visit: Payer: Medicaid Other | Admitting: Physical Therapy

## 2015-06-24 ENCOUNTER — Ambulatory Visit: Payer: Medicaid Other | Admitting: Physical Therapy
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
## 2015-07-19 IMAGING — RF DG TIBIA/FIBULA 2V*R*
1 series · 7 of 7 positions shown · non-contrast
Comparison: 04/29/2013

CLINICAL DATA: ORIF right tibial shaft fracture. Tibial nail.

EXAM:
DG C-ARM 61-120 MIN; PORTABLE RIGHT TIBIA AND FIBULA - 2 VIEW
FLUOROSCOPY TIME:
1 min, 34 seconds

[Series 1: run · 7 of 7 slices shown]
[im 1/7]
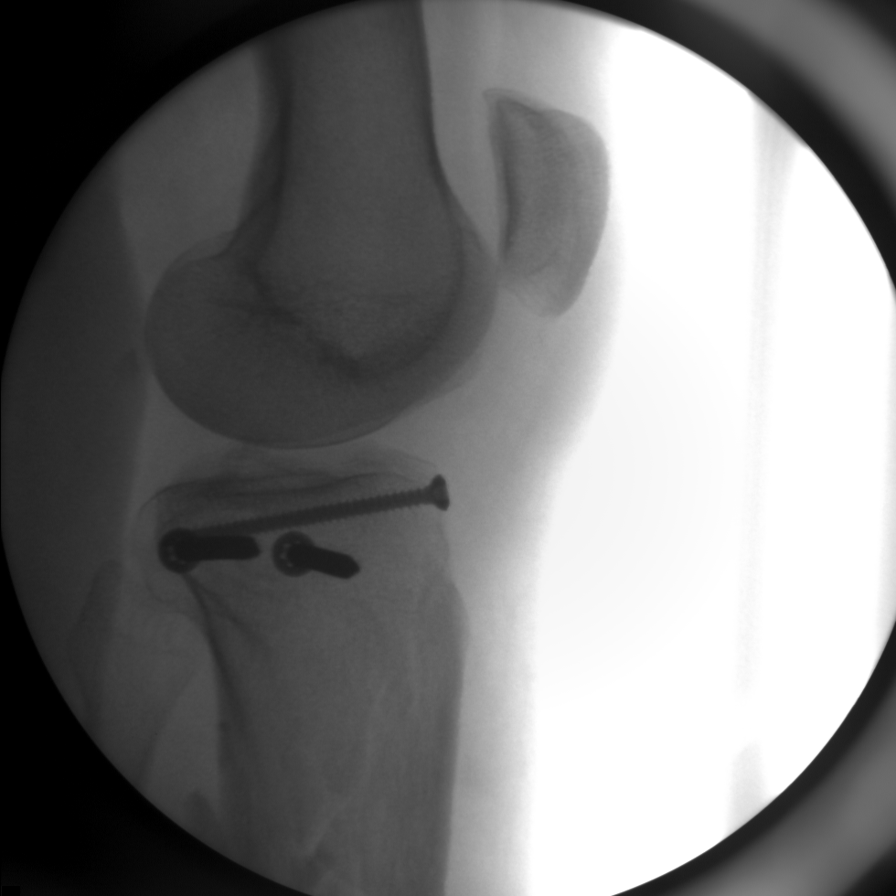
[im 2/7]
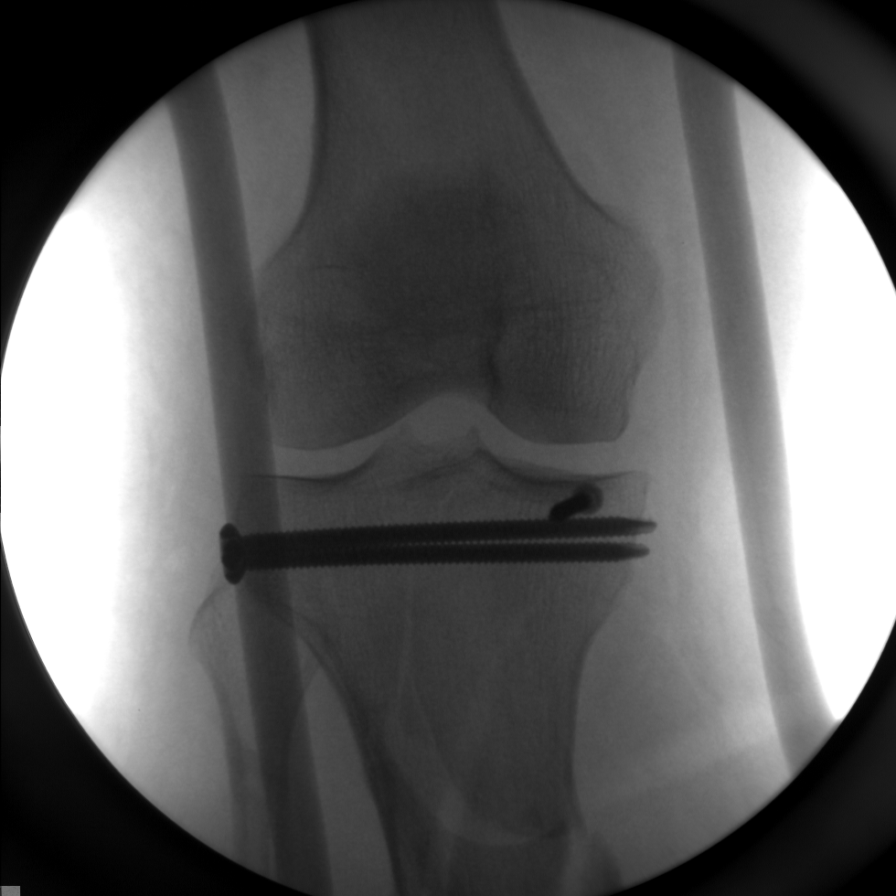
[im 3/7]
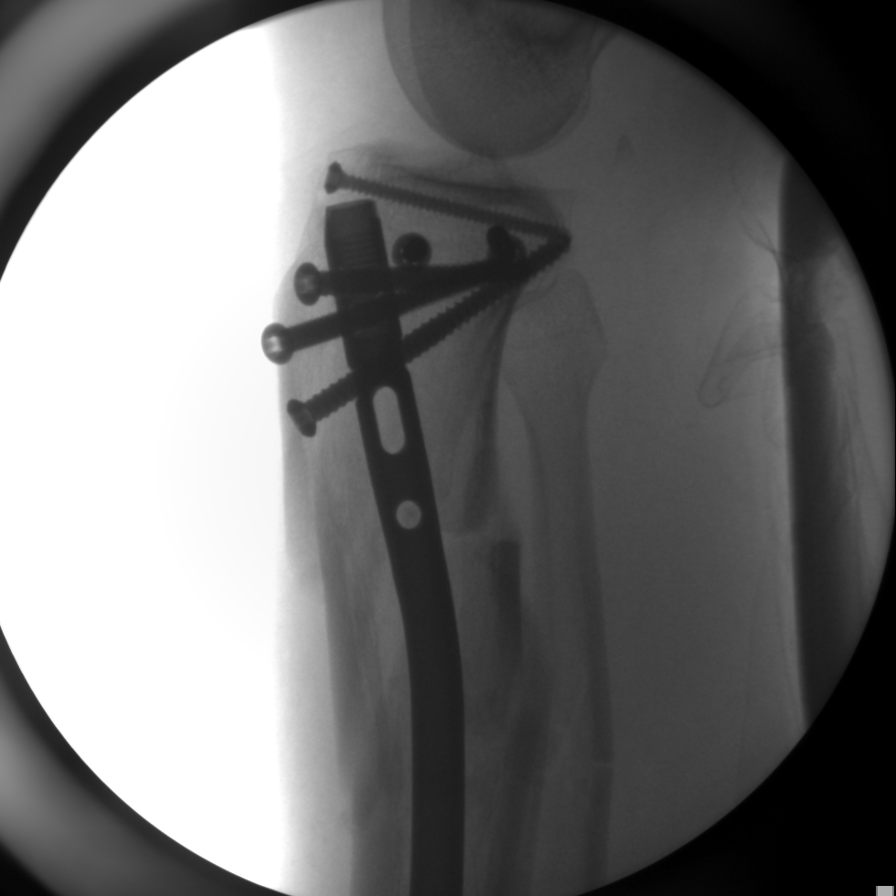
[im 4/7]
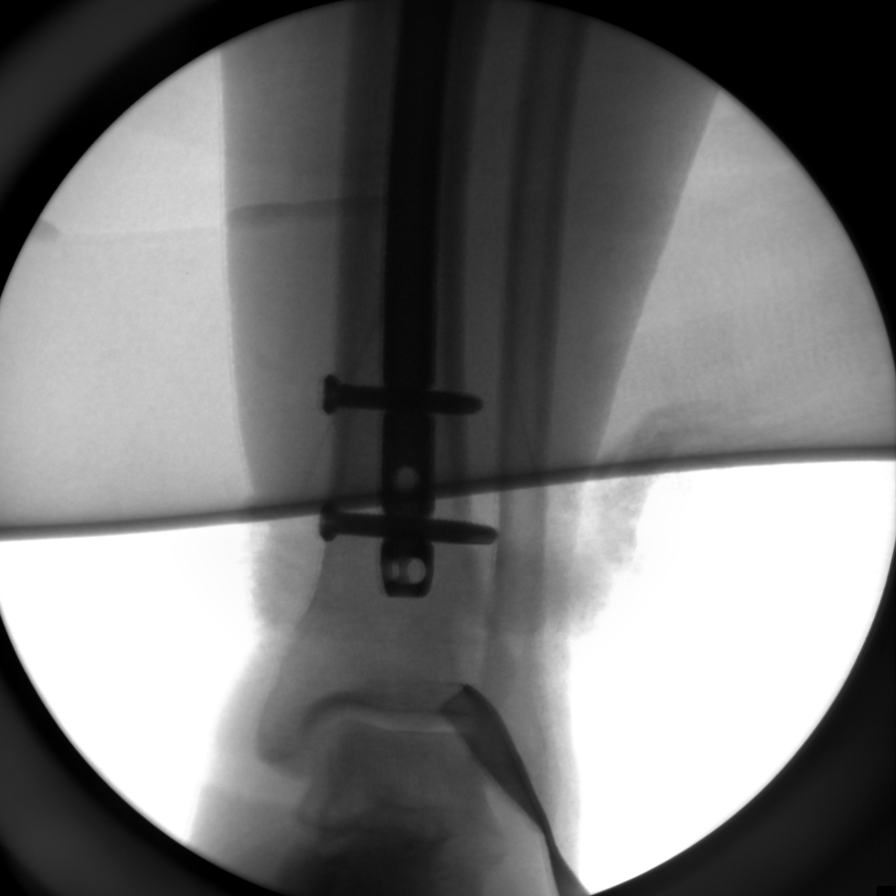
[im 5/7]
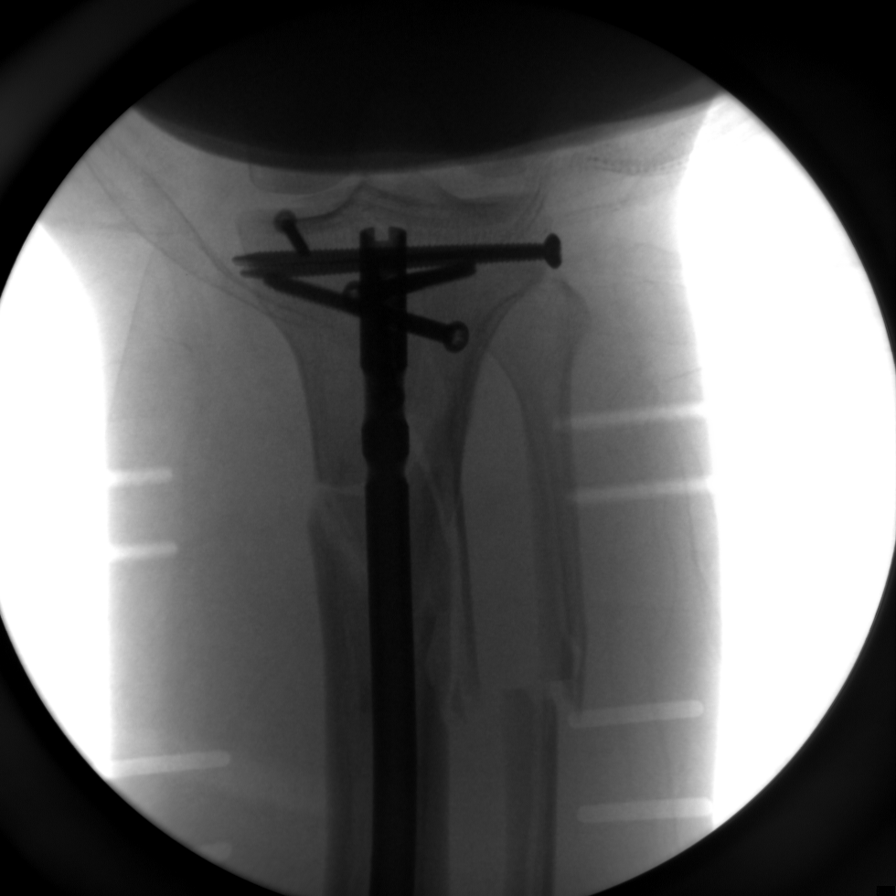
[im 6/7]
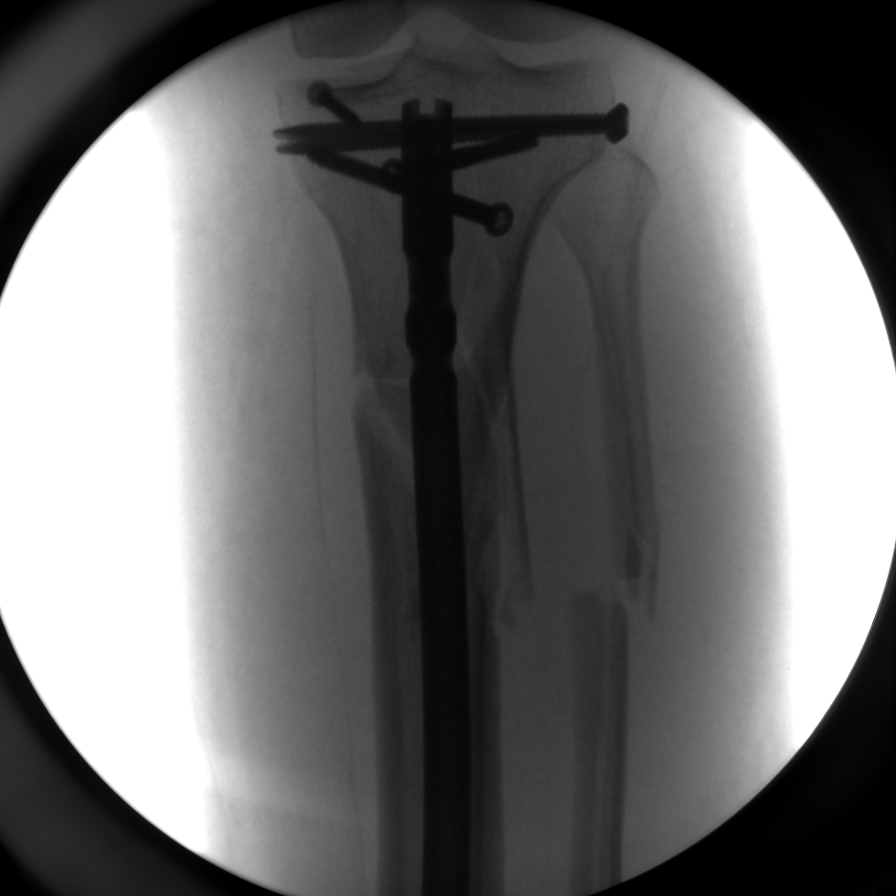
[im 7/7]
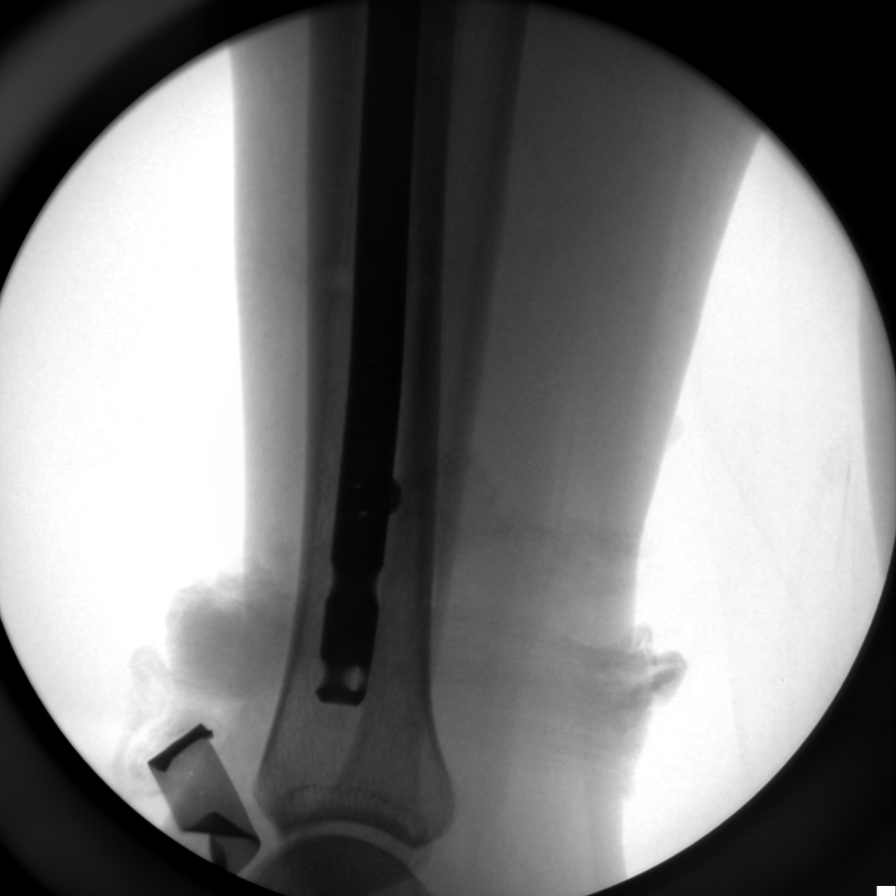

[7 of 7 positions shown; findings below may reference images not displayed]

FINDINGS: Images are performed at various stages of ORIF of the tibia. Tibial
nail traverses the length of the tibia and is transfixed with
proximal and distal screws. Alignment is near anatomic. Again noted
is fibular fracture.
IMPRESSION: ORIF of the tibia.

ADDENDUM:
Following the is C-arm fluoroscopy and spot views, portable two view
tib-fib study is also performed, confirming presence of a intra
medullary tibial nail, secured proximal and distal cortical screws.
Alignment is near anatomic across the comminuted proximal tibial
fracture. Fibular fracture is again noted.
IMPRESSION: ORIF of right tibial fracture.

## 2021-05-06 ENCOUNTER — Encounter: Payer: Self-pay | Admitting: Internal Medicine

## 2021-05-06 ENCOUNTER — Other Ambulatory Visit: Payer: Self-pay

## 2021-05-06 ENCOUNTER — Ambulatory Visit (INDEPENDENT_AMBULATORY_CARE_PROVIDER_SITE_OTHER): Payer: Medicare Other | Admitting: Internal Medicine

## 2021-05-06 VITALS — BP 140/82 | HR 91 | Temp 98.0°F | Resp 16 | Ht 63.0 in | Wt 194.4 lb

## 2021-05-06 DIAGNOSIS — J4550 Severe persistent asthma, uncomplicated: Secondary | ICD-10-CM

## 2021-05-06 DIAGNOSIS — K219 Gastro-esophageal reflux disease without esophagitis: Secondary | ICD-10-CM

## 2021-05-06 DIAGNOSIS — J310 Chronic rhinitis: Secondary | ICD-10-CM

## 2021-05-06 HISTORY — DX: Gastro-esophageal reflux disease without esophagitis: K21.9

## 2021-05-06 HISTORY — DX: Chronic rhinitis: J31.0

## 2021-05-06 MED ORDER — AZELASTINE HCL 0.1 % NA SOLN: 2.0000 | Freq: Two times a day (BID) | NASAL | 12 refills | Status: DC | PRN

## 2021-05-06 MED ORDER — BREZTRI AEROSPHERE 160-9-4.8 MCG/ACT IN AERO: 2.0000 | INHALATION_SPRAY | Freq: Two times a day (BID) | RESPIRATORY_TRACT | 2 refills | Status: DC

## 2021-05-06 MED ORDER — OLOPATADINE HCL 0.2 % OP SOLN: 1.0000 [drp] | OPHTHALMIC | 5 refills | Status: DC

## 2021-05-06 NOTE — Progress Notes (Signed)
NEW PATIENT Date of Service/Encounter:  05/06/21 Referring provider: Darryl Lent, PA-C Primary care provider: Darryl Lent, PA-C  Subjective:  [name] is a 60 y.o. female with a PMHx of vitamin D deficiency, hyperparathyroidism, HTN presenting today for evaluation of chronic cough and rhinitis. History obtained from: chart review and patient.   Coughs with montelukast, so she doesn't take this.    Additionally gets itchy red eyes with sore throat.  Sometimes has congestion.  Year round symptoms.  Asthma: Diagnosed as an adult.  Current symptoms include chest tightness, cough, shortness of breath, and wheezing A couple of times/week daytime symptoms in past month, nightly nighttime awakenings in past month Using rescue inhaler several times per week Limitations to daily activity: some 0 ED visits, 0 UC visits and 0 oral steroids in the past year 0 number of lifetime hospitalizations, 0 number of lifetime intubations.  Identified Triggers: exercise  Current regimen:  Maintenance: Advair 500 1 puffs BID Rescue: Albuterol 2 puffs q4-6 hrs PRN, using prior to exercise Up-to-date with pneumonia,, Covid-19,, and Flu, vaccines. History of prior pneumonias: 0 History of prior COVID-19 infection: 0 Smoking exposure: no  On review of chart, she was evaluated by [facility] in 2020.  At that time environmental allergy testing was negative.  She was continued on her Advair 500 Diskus 1 puff twice a day. She also reported uncontrolled reflux and was told to continue Pepcid 20 mg twice daily.  However at today's visit she reports she is not taking anything for reflux. A chest x-ray at that time was read as normal.  Other allergy screening: Food allergy: no Medication allergy: no Hymenoptera allergy: no Urticaria: no Eczema:no  Past Medical History: Past Medical History:  Diagnosis Date   Arthritis    Asthma    Depression    Hyperparathyroidism (HCC) 01/22/2014    Hypothyroidism    Vitamin D insufficiency 01/22/2014   Medication List:  Current Outpatient Medications  Medication Sig Dispense Refill   azelastine (ASTELIN) 0.1 % nasal spray Place 2 sprays into both nostrils 2 (two) times daily as needed for rhinitis. Use in each nostril as directed 30 mL 12   Budeson-Glycopyrrol-Formoterol (BREZTRI AEROSPHERE) 160-9-4.8 MCG/ACT AERO Inhale 2 puffs into the lungs in the morning and at bedtime. 10.7 g 2   famotidine (PEPCID) 20 MG tablet Take 20 mg by mouth 2 (two) times daily.     fluticasone (FLONASE) 50 MCG/ACT nasal spray Place 1 spray into both nostrils daily.     hydrochlorothiazide (HYDRODIURIL) 25 MG tablet Take 25 mg by mouth daily.     levothyroxine (SYNTHROID, LEVOTHROID) 50 MCG tablet Take 50 mcg by mouth daily before breakfast.     methocarbamol (ROBAXIN) 500 MG tablet Take 1-2 tablets (500-1,000 mg total) by mouth every 6 (six) hours as needed for muscle spasms. 80 tablet 0   Olopatadine HCl (PATADAY) 0.2 % SOLN Place 1 drop into both eyes 1 day or 1 dose. 2.5 mL 5   omeprazole (PRILOSEC) 40 MG capsule Take 40 mg by mouth daily.     ursodiol (ACTIGALL) 300 MG capsule Take 300 mg by mouth 2 (two) times daily.     No current facility-administered medications for this visit.   Known Allergies:  No Known Allergies Past Surgical History: Past Surgical History:  Procedure Laterality Date   EXTERNAL FIXATION LEG Right 04/29/2013   Procedure: EXTERNAL FIXATION LEG;  Surgeon: Budd Palmer, MD;  [facility];  Service: Orthopedics;  Laterality:  Right;   ORIF PELVIC FRACTURE N/A 04/29/2013   Procedure: OPEN REDUCTION INTERNAL FIXATION (ORIF) PELVIC FRACTURE;  Surgeon: Budd Palmer, MD;  [facility];  Service: Orthopedics;  Laterality: N/A;   ORIF TIBIA FRACTURE Right 01/20/2014   Procedure: OPEN REDUCTION INTERNAL FIXATION (ORIF) RIGHT PROXIMAL TIBIA FRACTURE/HARDWARE REMOVAL;  Surgeon: Budd Palmer, MD;  [facility];  Service:  Orthopedics;  Laterality: Right;   TIBIA IM NAIL INSERTION Right 05/01/2013   Procedure: INTRAMEDULLARY (IM) NAIL RIGHT TIBIAL;  Surgeon: Budd Palmer, MD;  [facility];  Service: Orthopedics;  Laterality: Right;   Family History: Family History  Problem Relation Age of Onset   Allergic rhinitis Sister    Social History: [name] lives In a house built 65 years ago, no water damage, carpet in the bedroom, gas heating, central AC, no pets, no pests, no dust mite protection on bedding, no smoking history and no secondhand smoke exposure.  Does not work and draws disability..   ROS:  All other systems negative except as noted per HPI.  Objective:  Blood pressure 140/82, pulse 91, temperature 98 F (36.7 C), temperature source Temporal, resp. rate 16, height 5\' 3"  (1.6 m), weight 194 lb 6.4 oz (88.2 kg), SpO2 100 %. Body mass index is 34.44 kg/m. Physical Exam:  General Appearance:  Alert, cooperative, no distress, appears stated age  Head:  Normocephalic, without obvious abnormality, atraumatic  Eyes:  Conjunctiva clear, EOM's intact  Nose: Nares normal, hypertrophic turbinates, left worse than right  Throat: Lips, tongue normal; teeth and gums normal, normal posterior oropharynx  Neck: Supple, symmetrical  Lungs:   Respirations unlabored, intermittent dry coughing heard throughout visit worsen following albuterol  Heart:  Regular rate and rhythm, no murmur appears well perfused  Extremities: No edema  Skin: Skin color, texture, turgor normal, no rashes or lesions on visualized portions of skin  Neurologic: No gross deficits   Diagnostics: Spirometry:  Tracings reviewed. Her effort: Good reproducible efforts. FVC: 1.89L (pre), 1.94L  (post) FEV1: 1.53L, 75% predicted (pre), 1.53L, 75% predicted (post) FEV1/FVC ratio: 101% (pre), 99% (post) Interpretation:  No overt obstruction on pretest  without bronchodilator response but had used albuterol 1 hour prior to testing Please  see scanned spirometry results for details.   Skin Testing: Environmental allergy panel. Adequate positive and negative controls. Results discussed with patient/family.  Airborne Adult Perc - 05/06/21 1359     Time Antigen Placed 1400    Allergen Manufacturer Waynette Buttery    Location Back    Number of Test 59    1. Control-Buffer 50% Glycerol Negative    2. Control-Histamine 1 mg/ml 3+    3. Albumin saline Negative    4. Bahia Negative    5. French Southern Territories Negative    6. Johnson Negative    7. [place] Blue Negative    8. Meadow Fescue Negative    9. Perennial Rye Negative    10. Sweet Vernal Negative    11. Timothy Negative    12. Cocklebur Negative    13. Burweed Marshelder Negative    14. Ragweed, short Negative    15. Ragweed, Giant Negative    16. Plantain,  English Negative    17. Lamb's Quarters Negative    18. Sheep Sorrell Negative    19. Rough Pigweed Negative    20. Marsh Elder, Rough Negative    21. Mugwort, Common Negative    22. Ash mix Negative    23. Charletta Cousin mix Negative  24. Beech American Negative    25. Box, Elder Negative    26. Cedar, red Negative    27. Cottonwood, Guinea-Bissau Negative    28. Elm mix Negative    29. Hickory Negative    30. Maple mix Negative    31. Oak, Guinea-Bissau mix Negative    32. Pecan Pollen Negative    33. Pine mix Negative    34. Sycamore Eastern Negative    35. Walnut, Black Pollen Negative    36. Alternaria alternata Negative    37. Cladosporium Herbarum Negative    38. Aspergillus mix Negative    39. Penicillium mix Negative    40. Bipolaris sorokiniana (Helminthosporium) Negative    41. Drechslera spicifera (Curvularia) Negative    42. Mucor plumbeus Negative    43. Fusarium moniliforme Negative    44. Aureobasidium pullulans (pullulara) Negative    45. Rhizopus oryzae Negative    46. Botrytis cinera Negative    47. Epicoccum nigrum Negative    48. Phoma betae Negative    49. Candida Albicans Negative    50. Trichophyton  mentagrophytes Negative    51. Mite, D Farinae  5,000 AU/ml Negative    52. Mite, D Pteronyssinus  5,000 AU/ml Negative    53. Cat Hair 10,000 BAU/ml Negative    54.  Dog Epithelia Negative    55. Mixed Feathers Negative    56. Horse Epithelia Negative    57. Cockroach, German Negative    58. Mouse Negative    59. Tobacco Leaf Negative             Intradermal - 05/06/21 1504     Time Antigen Placed 1504    Allergen Manufacturer Other    Location Arm    Number of Test 15    Control Negative    French Southern Territories Negative    Johnson Negative    7 Grass Negative    Ragweed mix Negative    Weed mix Negative    Tree mix Negative    Mold 1 Negative    Mold 2 Negative    Mold 3 Negative    Mold 4 Negative    Cat Negative    Dog Negative    Cockroach Negative    Mite mix Negative             Allergy testing results were read and interpreted by myself, documented by clinical staff.  Assessment and Plan   Patient Instructions  Severe Persistent Asthma-uncontrolled -Your breathing test today looked okay and did not show significant response to albuterol (possibly related to her having used albuterol within 1 hour of initial testing) - Stop Avair discus - Start Breztri 2 puffs twice a day with a spacer; THIS SHOULD BE USED EVERY DAY - Rinse mouth out after use - Use Albuterol (Proair/Ventolin) 2 puffs every 4-6 hours as needed for chest tightness, wheezing, or coughing - Use Albuterol (Proair/Ventolin) 2 puffs 15 minutes prior to exercise if you have symptoms with activity - Use a spacer with all inhalers - please keep track of how often you are needing rescue inhaler Albuterol (Proair/Ventolin) as this will help guide future management - Asthma is not controlled if:  - Symptoms are occurring >2 times a week OR  - >2 times a month nighttime awakenings  - Please call the clinic to schedule a follow up if these symptoms arise -Labs today: CBC with differential, total IgE to  assess candidacy for asthma biologic Technique with albuterol use  was noted to be poor.  Real-time education provided on proper use of inhalers.  Chronic Rhinitis and conjunctivitis- Nonallergic: - allergy testing today was negative -Continue Flonase (fluticasone) 1- 2 sprays in each nostril daily. Best results if used daily. - Start Astelin (Azelastine) 1-2 sprays in each nostril twice a day as needed.  You may use this as needed for nasal congestion/itchy ears/itchy nose if desired - Start Pataday (Olopatadine) 1 drop as needed daily for itchy watery eyes  Reflux-uncontrolled and potentially contributing to chronic cough:  - restart omeprazole 40 mg every morning - lifestyle modification  Follow-up in 4 to 6 weeks.  This note in its entirety was forwarded to the Provider who requested this consultation.  Thank you for your kind referral. I appreciate the opportunity to take part in Magdelena's care. Please do not hesitate to contact me with questions.  Sincerely,  Tonny Bollman, MD [facility]

## 2021-05-06 NOTE — Patient Instructions (Addendum)
Severe Persistent Asthma-uncontrolled -Your breathing test today looked okay and did not show significant response to albuterol - Stop Avair discus - Start Breztri 2 puffs twice a day with a spacer; THIS SHOULD BE USED EVERY DAY - Rinse mouth out after use - Use Albuterol (Proair/Ventolin) 2 puffs every 4-6 hours as needed for chest tightness, wheezing, or coughing - Use Albuterol (Proair/Ventolin) 2 puffs 15 minutes prior to exercise if you have symptoms with activity - Use a spacer with all inhalers - please keep track of how often you are needing rescue inhaler Albuterol (Proair/Ventolin) as this will help guide future management - Asthma is not controlled if:  - Symptoms are occurring >2 times a week OR  - >2 times a month nighttime awakenings  - Please call the clinic to schedule a follow up if these symptoms arise -Labs today: CBC with differential, total IgE to assess candidacy for asthma biologic  Chronic Rhinitis- Nonallergic: - allergy testing today was negative -Continue Flonase (fluticasone) 1- 2 sprays in each nostril daily. Best results if used daily. - Start Astelin (Azelastine) 1-2 sprays in each nostril twice a day as needed.  You may use this as needed for nasal congestion/itchy ears/itchy nose if desired - Start Pataday (Olopatadine) 1 drop as needed daily for itchy watery eyes  Reflux:  - restart omeprazole 40 mg every morning - lifestyle modification  Follow-up in 4 to 6 weeks.  ------------------------------------------------------------------------ Gastroesophageal Reflux Induced Respiratory Disease and Laryngopharyngeal Reflux (LPR): Gastroesophageal reflux disease (GERD) is a condition where the contents of the stomach reflux or back up into the esophagus or swallowing tube.  This can result in a variety of clinical symptoms including classic symptoms and atypical symptoms.  Classic symptoms of GERD include: heartburn, chest pain, acid taste in the mouth, and  difficulty in swallowing.  Atypical symptoms of GERD include laryngopharyngeal reflux (LPR) and asthma.  LPR occurs when stomach reflux comes all the way up to the throat.  Clinical symptoms include hoarseness, raspy voice, laryngitis, throat clearing, postnasal drip, mucus stuck in the throat, a sensation of a lump in the throat, sore throat, and cough.  Most patients with LPR do not have classic symptoms of GERD.  Asthma can also be triggered by GERD.  The acid stomach fluid can stimulate nerve fibers in the esophagus which can cause an increase in bronchial muscle tone and narrowing of the airways.  Acid stomach contents may also reflux into the trachea and bronchi of the lungs where it can trigger an asthma attack.  Many people with GERD triggered asthma do not have classic symptoms of GERD.  Diagnosis of LPR and GERD induced asthma is frequently made from a typical history and response to medications.  It may take several months of medications to see a good response.  Occasionally, a 24-hour esophageal pH probe study must be performed.  Treatment of GERD/LPR includes:   Modification of diet and lifestyle Stop smoking Avoid overeating and lose weight Avoid acidic and fatty foods, chocolate, onions, garlic, peppermint Elevate the head of your bed 6 to 8 inches with blocks or wedge Medications Zantac, Pepcid, Axid, Tagamet Prilosec, Prevacid, Aciphex, Protonix, Nexium Surgery

## 2021-05-09 ENCOUNTER — Telehealth: Payer: Self-pay | Admitting: Internal Medicine

## 2021-05-09 MED ORDER — BREZTRI AEROSPHERE 160-9-4.8 MCG/ACT IN AERO: 2.0000 | INHALATION_SPRAY | Freq: Two times a day (BID) | RESPIRATORY_TRACT | 2 refills | Status: DC

## 2021-05-09 MED ORDER — OLOPATADINE HCL 0.2 % OP SOLN: 1.0000 [drp] | OPHTHALMIC | 5 refills | Status: DC

## 2021-05-09 MED ORDER — AZELASTINE HCL 0.1 % NA SOLN: 2.0000 | Freq: Two times a day (BID) | NASAL | 12 refills | Status: DC | PRN

## 2021-05-09 NOTE — Telephone Encounter (Signed)
Pt states the prescriptions that were sent in olopatadine, breztri and azelastine should have been sent to [facility] on [address]. She is asking that you resend to [facility].

## 2021-05-09 NOTE — Telephone Encounter (Signed)
Medications sent to right pharmacy [facility] [address]

## 2021-06-16 NOTE — Progress Notes (Signed)
FOLLOW UP Date of Service/Encounter:  06/17/21   Subjective:  [name] (DOB: 07-17-1960) is a 60 y.o. female who [facility] in re-evaluation of the following: Asthma, nonallergic rhinitis, reflux History obtained from: chart review and patient.  For Review, LV was on 05/06/21  with Dr.Kiko Ripp seen for  -severe persistent asthma (switch from Advair 500 Diskus to [employer]), FEV1 75% - Nonallergic rhinitis-continued on Flonase and added Astelin and Pataday -Reflux uncontrolled-restarted omeprazole 40 mg daily  Pertinent history/diagnostics:   -evaluated by [facility] allergy in 2020.  At that time environmental allergy testing was negative.  She was continued on her Advair 500 Diskus 1 puff twice a day. She also reported uncontrolled reflux and was told to continue Pepcid 20 mg twice daily.  However at initial visit she reports she is not taking anything for reflux. A chest x-ray at that time was read as normal. -05/06/2021 spirometry (pre-/post) showed no overt obstruction without bronchodilator response-however had taken albuterol 1 hour prior to testing - 05/06/2021 SPT environmental panel negative, intradermal's negative -CBC differential ordered and IgE ordered but not obtained-most recent AEC on 12/10/18--200  Today she presents for follow-up. Since our last visit, she is coughing much less than prior.  She is using Breztri twice a day.  She is taking omeprazole 40 mg daily.  She has not been using her albuterol because she is out of refills.   She will use albuterol before exercise.  She uses twice a week. Her reflux is better, but not completely resolved. Drainage is still present in the morning.  It improved significantly with the nasal sprays provided  Allergies as of 06/17/2021   No Known Allergies      Medication List        Accurate as of June 17, 2021  4:47 PM. If you have any questions, ask your nurse or doctor.           albuterol 108 (90 Base) MCG/ACT inhaler Commonly known as: VENTOLIN HFA Inhale 2 puffs into the lungs every 6 (six) hours as needed for wheezing or shortness of breath. Started by: Tonny Bollman, MD   azelastine 0.1 % nasal spray Commonly known as: ASTELIN Place 2 sprays into both nostrils 2 (two) times daily as needed for rhinitis. Use in each nostril as directed   Breztri Aerosphere 160-9-4.8 MCG/ACT Aero Generic drug: Budeson-Glycopyrrol-Formoterol Inhale 2 puffs into the lungs in the morning and at bedtime.   famotidine 20 MG tablet Commonly known as: PEPCID Take 20 mg by mouth 2 (two) times daily.   fluticasone 50 MCG/ACT nasal spray Commonly known as: FLONASE Place 2 sprays into both nostrils daily. What changed: how much to take Changed by: Tonny Bollman, MD   hydrochlorothiazide 25 MG tablet Commonly known as: HYDRODIURIL Take 25 mg by mouth daily.   levothyroxine 50 MCG tablet Commonly known as: SYNTHROID Take 50 mcg by mouth daily before breakfast.   methocarbamol 500 MG tablet Commonly known as: ROBAXIN Take 1-2 tablets (500-1,000 mg total) by mouth every 6 (six) hours as needed for muscle spasms.   Olopatadine HCl 0.2 % Soln Commonly known as: Pataday Place 1 drop into both eyes 1 day or 1 dose.   omeprazole 40 MG capsule Commonly known as: PRILOSEC Take 1 capsule (40 mg total) by mouth in the morning and at bedtime. What changed: when to take this Changed by: Tonny Bollman, MD   ursodiol 300 MG capsule Commonly known  as: ACTIGALL Take 300 mg by mouth 2 (two) times daily.       Past Medical History:  Diagnosis Date   Arthritis    Asthma    Depression    Hyperparathyroidism (HCC) 01/22/2014   Hypothyroidism    Vitamin D insufficiency 01/22/2014   Past Surgical History:  Procedure Laterality Date   EXTERNAL FIXATION LEG Right 04/29/2013   Procedure: EXTERNAL FIXATION LEG;  Surgeon: Budd Palmer, MD;  Location: [facility] OR;  Service: Orthopedics;   Laterality: Right;   ORIF PELVIC FRACTURE N/A 04/29/2013   Procedure: OPEN REDUCTION INTERNAL FIXATION (ORIF) PELVIC FRACTURE;  Surgeon: Budd Palmer, MD;  Location: [facility] OR;  Service: Orthopedics;  Laterality: N/A;   ORIF TIBIA FRACTURE Right 01/20/2014   Procedure: OPEN REDUCTION INTERNAL FIXATION (ORIF) RIGHT PROXIMAL TIBIA FRACTURE/HARDWARE REMOVAL;  Surgeon: Budd Palmer, MD;  Location: [facility] OR;  Service: Orthopedics;  Laterality: Right;   TIBIA IM NAIL INSERTION Right 05/01/2013   Procedure: INTRAMEDULLARY (IM) NAIL RIGHT TIBIAL;  Surgeon: Budd Palmer, MD;  Location: [facility] OR;  Service: Orthopedics;  Laterality: Right;   Otherwise, there have been no changes to her past medical history, surgical history, family history, or social history.  ROS: All others negative except as noted per HPI.   Objective:  BP (!) 130/58   Pulse 73   Temp 97.8 F (36.6 C) (Temporal)   Resp 18   Ht 5\' 3"  (1.6 m)   Wt 197 lb 3.2 oz (89.4 kg)   SpO2 97%   BMI 34.93 kg/m  Body mass index is 34.93 kg/m. Physical Exam: General Appearance:  Alert, cooperative, no distress, appears stated age  Head:  Normocephalic, without obvious abnormality, atraumatic  Eyes:  Conjunctiva clear, EOM's intact  Nose: Nares normal, mucosa erythematous and slightly edematous  Throat: Lips, tongue normal; several missing teeth and gums normal  Neck: Supple, symmetrical  Lungs:   Clear to auscultation bilaterally, respirations unlabored, no coughing  Heart:  Regular rate and rhythm, no murmur, appears well perfused  Extremities: No edema  Skin: Skin color, texture, turgor normal, no rashes or lesions on visualized portions of skin  Neurologic: No gross deficits    Spirometry:  Tracings reviewed. Her effort: Good reproducible efforts. FVC: 1.93L FEV1: 1.44L, 71% predicted FEV1/FVC ratio: 94% Interpretation: Spirometry consistent with normal pattern.  Please see scanned spirometry results for  details.  Assessment/Plan   Patient Instructions  Severe Persistent Asthma-improved -Your breathing test today looked good; similar to last visit - Continue Breztri 2 puffs twice a day with a spacer; THIS SHOULD BE USED EVERY DAY - Rinse mouth out after use - Use Albuterol (Proair/Ventolin) 2 puffs every 4-6 hours as needed for chest tightness, wheezing, or coughing and 2 puffs 15 minutes prior to exercise if you have symptoms with activity - Asthma is not controlled if:  - Symptoms are occurring >2 times a week OR  - >2 times a month nighttime awakenings  - Please call the clinic to schedule a follow up if these symptoms arise -Labs today: CBC with differential, total IgE to assess candidacy for asthma biologic  Chronic Rhinitis- Nonallergic:improved - allergy testing today was negative -Continue Flonase (fluticasone) 1- 2 sprays in each nostril daily. Best results if used daily. - continue Astelin (Azelastine) 1-2 sprays in each nostril twice a day as needed.  You may use this as needed for nasal congestion/itchy ears/itchy nose if desired - Continue Pataday (Olopatadine) 1 drop as needed daily for  itchy watery eyes  Reflux: uncontrolled - increase omeprazole 40 mg twice a day every morning - lifestyle modification  Follow-up in 3 months.  Tonny Bollman, MD  [facility]

## 2021-06-17 ENCOUNTER — Other Ambulatory Visit: Payer: Self-pay

## 2021-06-17 ENCOUNTER — Ambulatory Visit (INDEPENDENT_AMBULATORY_CARE_PROVIDER_SITE_OTHER): Payer: Medicare Other | Admitting: Internal Medicine

## 2021-06-17 ENCOUNTER — Encounter: Payer: Self-pay | Admitting: Internal Medicine

## 2021-06-17 VITALS — BP 130/58 | HR 73 | Temp 97.8°F | Resp 18 | Ht 63.0 in | Wt 197.2 lb

## 2021-06-17 DIAGNOSIS — J4550 Severe persistent asthma, uncomplicated: Secondary | ICD-10-CM

## 2021-06-17 MED ORDER — OLOPATADINE HCL 0.2 % OP SOLN: 1.0000 [drp] | OPHTHALMIC | 5 refills | Status: DC

## 2021-06-17 MED ORDER — AZELASTINE HCL 0.1 % NA SOLN
2.0000 | Freq: Two times a day (BID) | NASAL | 12 refills | Status: DC | PRN
Start: 2021-06-17 — End: 2021-06-17

## 2021-06-17 MED ORDER — FLUTICASONE PROPIONATE 50 MCG/ACT NA SUSP: 2.0000 | Freq: Every day | NASAL | 3 refills | Status: DC

## 2021-06-17 MED ORDER — BREZTRI AEROSPHERE 160-9-4.8 MCG/ACT IN AERO
2.0000 | INHALATION_SPRAY | Freq: Two times a day (BID) | RESPIRATORY_TRACT | 2 refills | Status: DC
Start: 2021-06-17 — End: 2022-01-16

## 2021-06-17 MED ORDER — OMEPRAZOLE 40 MG PO CPDR: 40.0000 mg | DELAYED_RELEASE_CAPSULE | Freq: Two times a day (BID) | ORAL | 5 refills | Status: DC

## 2021-06-17 MED ORDER — BREZTRI AEROSPHERE 160-9-4.8 MCG/ACT IN AERO: 2.0000 | INHALATION_SPRAY | Freq: Two times a day (BID) | RESPIRATORY_TRACT | 2 refills | Status: DC

## 2021-06-17 MED ORDER — ALBUTEROL SULFATE HFA 108 (90 BASE) MCG/ACT IN AERS: 2.0000 | INHALATION_SPRAY | Freq: Four times a day (QID) | RESPIRATORY_TRACT | 2 refills | Status: DC | PRN

## 2021-06-17 MED ORDER — AZELASTINE HCL 0.1 % NA SOLN: 2.0000 | Freq: Two times a day (BID) | NASAL | 12 refills | Status: DC | PRN

## 2021-06-17 NOTE — Patient Instructions (Addendum)
Severe Persistent Asthma-improved -Your breathing test today looked good; similar to last visit - Continue Breztri 2 puffs twice a day with a spacer; THIS SHOULD BE USED EVERY DAY - Rinse mouth out after use - Use Albuterol (Proair/Ventolin) 2 puffs every 4-6 hours as needed for chest tightness, wheezing, or coughing and 2 puffs 15 minutes prior to exercise if you have symptoms with activity - Asthma is not controlled if:  - Symptoms are occurring >2 times a week OR  - >2 times a month nighttime awakenings  - Please call the clinic to schedule a follow up if these symptoms arise -Labs today: CBC with differential, total IgE to assess candidacy for asthma biologic  Chronic Rhinitis- Nonallergic:improved - allergy testing today was negative -Continue Flonase (fluticasone) 1- 2 sprays in each nostril daily. Best results if used daily. - continue Astelin (Azelastine) 1-2 sprays in each nostril twice a day as needed.  You may use this as needed for nasal congestion/itchy ears/itchy nose if desired - Continue Pataday (Olopatadine) 1 drop as needed daily for itchy watery eyes  Reflux: uncontrolled - increase omeprazole 40 mg twice a day every morning - lifestyle modification  Follow-up in 3 months.  ------------------------------------------------------------------------ Gastroesophageal Reflux Induced Respiratory Disease and Laryngopharyngeal Reflux (LPR): Gastroesophageal reflux disease (GERD) is a condition where the contents of the stomach reflux or back up into the esophagus or swallowing tube.  This can result in a variety of clinical symptoms including classic symptoms and atypical symptoms.  Classic symptoms of GERD include: heartburn, chest pain, acid taste in the mouth, and difficulty in swallowing.  Atypical symptoms of GERD include laryngopharyngeal reflux (LPR) and asthma.  LPR occurs when stomach reflux comes all the way up to the throat.  Clinical symptoms include hoarseness, raspy  voice, laryngitis, throat clearing, postnasal drip, mucus stuck in the throat, a sensation of a lump in the throat, sore throat, and cough.  Most patients with LPR do not have classic symptoms of GERD.  Asthma can also be triggered by GERD.  The acid stomach fluid can stimulate nerve fibers in the esophagus which can cause an increase in bronchial muscle tone and narrowing of the airways.  Acid stomach contents may also reflux into the trachea and bronchi of the lungs where it can trigger an asthma attack.  Many people with GERD triggered asthma do not have classic symptoms of GERD.  Diagnosis of LPR and GERD induced asthma is frequently made from a typical history and response to medications.  It may take several months of medications to see a good response.  Occasionally, a 24-hour esophageal pH probe study must be performed.  Treatment of GERD/LPR includes:   Modification of diet and lifestyle Stop smoking Avoid overeating and lose weight Avoid acidic and fatty foods, chocolate, onions, garlic, peppermint Elevate the head of your bed 6 to 8 inches with blocks or wedge Medications Zantac, Pepcid, Axid, Tagamet Prilosec, Prevacid, Aciphex, Protonix, Nexium Surgery

## 2021-06-20 ENCOUNTER — Telehealth: Payer: Self-pay

## 2021-06-20 NOTE — Telephone Encounter (Signed)
Informed patient that medicare doesn't want to cover the olopatadine 2% can purchase over the counter extra strength. Patient voiced understanding.

## 2021-06-22 ENCOUNTER — Telehealth: Payer: Self-pay

## 2021-06-22 NOTE — Telephone Encounter (Signed)
PA was sent now waiting on a response whether it's approved or denied.

## 2021-06-22 NOTE — Telephone Encounter (Signed)
Please disregard this referral. I put it under the wrong patient. Error.

## 2021-06-22 NOTE — Telephone Encounter (Signed)
Please make an appointment to a pulmonologist in [place] and see if there is any [facility] pulmonologist in [place] if not use pulmonologist through [place]. Patient lives in [place]. Asthma evaluate and treat per Thermon Leyland

## 2021-06-24 NOTE — Telephone Encounter (Signed)
Pa not needed for breztri s it is on insurances list of covered drugs

## 2021-09-15 NOTE — Progress Notes (Signed)
? ?FOLLOW UP ?Date of Service/Encounter:  09/16/21 ? ? ?Subjective:  ?[name] (DOB: 09-01-1960) is a 61 y.o. female who [facility]/2021 in re-evaluation of the following: Asthma, nonallergic rhinitis, reflux ?History obtained from: chart review and patient. ? ?For Review, LV was on 06/17/21  with Dr.Jolanta Cabeza.    FEV1 71% at last visit.  We also had her increase omeprazole 40 mg to twice a day as she continued to have issues with reflux.  We ordered labs to determine her candidacy for asthma biologic, but these have not been obtained. ? ?Pertinent history/diagnostics:  ? -[facility].  At that time environmental allergy testing was negative.  She was continued on her Advair 500 Diskus 1 puff twice a day. ?She also reported uncontrolled reflux and was told to continue Pepcid 20 mg twice daily.  However at initial visit she reports she is not taking anything for reflux. ?A chest x-ray at that time was read as normal. ?-05/06/2021 spirometry (pre-/post) showed no overt obstruction without bronchodilator response-however had taken albuterol 1 hour prior to testing ?- 05/06/2021 SPT environmental panel negative, intradermal's negative ?-CBC differential ordered and IgE ordered but not obtained-most recent AEC on 12/10/18--200 ?We have switched her from Advair 500 to MimbresBreztri ? ?Today she presents for follow-up. ?She is using albuterol about twice a day because when she walks she feels fatigued and gets easily out of breath.  She has not had a chance to go to [facility] for her blood work.  When she is not active, her breathing is mostly under control. ?Her rhinitis bothers her significantly.  She wakes up with a lot of nasal congestion. She uses Flonase 1 spray in the morning and evening.  She is using the Astelin some.  ?Her reflux is controlled on omeprazole 40 mg twice daily.  ? ?Allergies as of 09/16/2021   ?No Known Allergies ?  ? ?  ?Medication List   ?  ? ?  ? Accurate as of September 16, 2021  4:01 PM. If you have any questions, ask your nurse or doctor.  ?  ?  ? ?  ? ?STOP taking these medications   ? ?famotidine 20 MG tablet ?Commonly known as: PEPCID ?Stopped by: Tonny BollmanErin Markeya Mincy, MD ?  ? ?  ? ?TAKE these medications   ? ?albuterol 108 (90 Base) MCG/ACT inhaler ?Commonly known as: VENTOLIN HFA ?Inhale 2 puffs into the lungs every 6 (six) hours as needed for wheezing or shortness of breath. ?  ?azelastine 0.1 % nasal spray ?Commonly known as: ASTELIN ?Place 2 sprays into both nostrils at bedtime. Use in each nostril as directed ?What changed:  ?when to take this ?reasons to take this ?Changed by: Tonny BollmanErin Scottie Stanish, MD ?  ?Breztri Aerosphere 160-9-4.8 MCG/ACT Aero ?Generic drug: Budeson-Glycopyrrol-Formoterol ?Inhale 2 puffs into the lungs in the morning and at bedtime. ?  ?fluticasone 50 MCG/ACT nasal spray ?Commonly known as: FLONASE ?Place 2 sprays into both nostrils in the morning. ?What changed: when to take this ?Changed by: Tonny BollmanErin Milia Warth, MD ?  ?hydrochlorothiazide 25 MG tablet ?Commonly known as: HYDRODIURIL ?Take 25 mg by mouth daily. ?  ?levothyroxine 50 MCG tablet ?Commonly known as: SYNTHROID ?Take 50 mcg by mouth daily before breakfast. ?  ?methocarbamol 500 MG tablet ?Commonly known as: ROBAXIN ?Take 1-2 tablets (500-1,000 mg total) by mouth every 6 (six) hours as needed for muscle spasms. ?  ?montelukast 10 MG tablet ?Commonly known  as: Singulair ?Take 1 tablet (10 mg total) by mouth at bedtime. ?Started by: Tonny Bollman, MD ?  ?Olopatadine HCl 0.2 % Soln ?Commonly known as: Pataday ?Place 1 drop into both eyes 1 day or 1 dose. ?  ?omeprazole 40 MG capsule ?Commonly known as: PRILOSEC ?Take 1 capsule (40 mg total) by mouth in the morning and at bedtime. ?  ?ursodiol 300 MG capsule ?Commonly known as: ACTIGALL ?Take 300 mg by mouth 2 (two) times daily. ?  ? ?  ? ?Past Medical History:  ?Diagnosis Date  ? Arthritis   ? Asthma   ? Depression   ? Hyperparathyroidism  (HCC) 01/22/2014  ? Hypothyroidism   ? Vitamin D insufficiency 01/22/2014  ? ?Past Surgical History:  ?Procedure Laterality Date  ? EXTERNAL FIXATION LEG Right 04/29/2013  ? Procedure: EXTERNAL FIXATION LEG;  Surgeon: Budd Palmer, MD;  Location: [facility] OR;  Service: Orthopedics;  Laterality: Right;  ? ORIF PELVIC FRACTURE N/A 04/29/2013  ? Procedure: OPEN REDUCTION INTERNAL FIXATION (ORIF) PELVIC FRACTURE;  Surgeon: Budd Palmer, MD;  Location: [facility] OR;  Service: Orthopedics;  Laterality: N/A;  ? ORIF TIBIA FRACTURE Right 01/20/2014  ? Procedure: OPEN REDUCTION INTERNAL FIXATION (ORIF) RIGHT PROXIMAL TIBIA FRACTURE/HARDWARE REMOVAL;  Surgeon: Budd Palmer, MD;  Location: [facility] OR;  Service: Orthopedics;  Laterality: Right;  ? TIBIA IM NAIL INSERTION Right 05/01/2013  ? Procedure: INTRAMEDULLARY (IM) NAIL RIGHT TIBIAL;  Surgeon: Budd Palmer, MD;  Location: [facility] OR;  Service: Orthopedics;  Laterality: Right;  ? ?Otherwise, there have been no changes to her past medical history, surgical history, family history, or social history. ? ?ROS: All others negative except as noted per HPI.  ? ?Objective:  ?BP 136/74   Pulse 66   Temp 98 ?F (36.7 ?C) (Temporal)   Resp 17   Ht 5\' 3"  (1.6 m)   Wt 195 lb (88.5 kg)   SpO2 99%   BMI 34.54 kg/m?  ?Body mass index is 34.54 kg/m? ?Physical Exam: ?General Appearance:  Alert, cooperative, no distress, appears stated age  ?Head:  Normocephalic, without obvious abnormality, atraumatic  ?Eyes:  Conjunctiva clear, EOM's intact  ?Nose: Nares normal, hypertrophic turbinates, normal mucosa, and no visible anterior polyps  ?Throat: Lips, tongue normal; teeth and gums normal, normal posterior oropharynx  ?Neck: Supple, symmetrical  ?Lungs:   clear to auscultation bilaterally, Respirations unlabored, no coughing  ?Heart:  regular rate and rhythm and no murmur, Appears well perfused  ?Extremities: No edema  ?Skin: Skin color, texture, turgor normal, no rashes or lesions on visualized  portions of skin  ?Neurologic: No gross deficits  ? ?Spirometry:  ?Tracings reviewed. Her effort: Good reproducible efforts. ?FVC: 2.0L ?FEV1: 1.49L, 73% predicted ?FEV1/FVC ratio: 94% ?Interpretation:  non-obstructive pattern .  ?Please see scanned spirometry results for details. ? ?Assessment/Plan  ? ?Severe Persistent Asthma-improved ?Daily meds:  ?Breztri 2 puffs twice a day every day ?start singulair (montelukast) 10 mg nightly ?As needed:  ?Albuterol (Proair/Ventolin) 2 puffs every 4-6 hours as needed and 2 puffs 15 minutes prior to exercise if you have symptoms with activity ?3. Labs:  ? A.CBC with differential, total IgE to assess candidacy for asthma biologic ? ?Chronic Rhinitis- Nonallergic:uncontrolled ?Daily meds:  ?A. Flonase (fluticasone) 2 sprays in each nostril in the morning. Best results if used daily. ?B. Astelin (Azelastine) 2 sprays in each nostril at night    ?2. As needed:  ?A. Pataday (Olopatadine) eye drops ? ?Reflux: controlled ?omeprazole 40 mg twice  a day every morning ?2.  lifestyle modification ? ?Follow-up in 4 months, sooner if needed.  ?It was pleasure seeing you again today! ? ?Tonny Bollman, MD  ?[facility] ? ? ? ? ? ? ?

## 2021-09-16 ENCOUNTER — Other Ambulatory Visit: Payer: Self-pay

## 2021-09-16 ENCOUNTER — Ambulatory Visit (INDEPENDENT_AMBULATORY_CARE_PROVIDER_SITE_OTHER): Payer: Medicare Other | Admitting: Internal Medicine

## 2021-09-16 ENCOUNTER — Encounter: Payer: Self-pay | Admitting: Internal Medicine

## 2021-09-16 VITALS — BP 136/74 | HR 66 | Temp 98.0°F | Resp 17 | Ht 63.0 in | Wt 195.0 lb

## 2021-09-16 DIAGNOSIS — J310 Chronic rhinitis: Secondary | ICD-10-CM

## 2021-09-16 DIAGNOSIS — J4550 Severe persistent asthma, uncomplicated: Secondary | ICD-10-CM | POA: Diagnosis not present

## 2021-09-16 DIAGNOSIS — K219 Gastro-esophageal reflux disease without esophagitis: Secondary | ICD-10-CM | POA: Diagnosis not present

## 2021-09-16 MED ORDER — OLOPATADINE HCL 0.2 % OP SOLN: 1.0000 [drp] | OPHTHALMIC | 5 refills | Status: DC

## 2021-09-16 MED ORDER — FLUTICASONE PROPIONATE 50 MCG/ACT NA SUSP: 2.0000 | Freq: Every morning | NASAL | 3 refills | Status: DC

## 2021-09-16 MED ORDER — MONTELUKAST SODIUM 10 MG PO TABS: 10.0000 mg | ORAL_TABLET | Freq: Every day | ORAL | 3 refills | Status: DC

## 2021-09-16 MED ORDER — OMEPRAZOLE 40 MG PO CPDR: 40.0000 mg | DELAYED_RELEASE_CAPSULE | Freq: Two times a day (BID) | ORAL | 5 refills | Status: DC

## 2021-09-16 MED ORDER — AZELASTINE HCL 0.1 % NA SOLN: 2.0000 | Freq: Every evening | NASAL | 12 refills | Status: DC

## 2021-09-16 NOTE — Patient Instructions (Addendum)
Severe Persistent Asthma-improved ?Daily meds:  ?Breztri 2 puffs twice a day every day ?start singulair (montelukast) 10 mg nightly ?As needed:  ?Albuterol (Proair/Ventolin) 2 puffs every 4-6 hours as needed and 2 puffs 15 minutes prior to exercise if you have symptoms with activity ?3. Labs:  ? A.CBC with differential, total IgE to assess candidacy for asthma biologic ? ?Chronic Rhinitis- Nonallergic:uncontrolled ?Daily meds:  ?A. Flonase (fluticasone) 2 sprays in each nostril in the morning. Best results if used daily. ?B. Astelin (Azelastine) 2 sprays in each nostril at night    ?2. As needed:  ?A. Pataday (Olopatadine) eye drops ? ?Reflux: controlled ?omeprazole 40 mg twice a day every morning ?2.  lifestyle modification ? ?Follow-up in 4 months, sooner if needed.  ?It was pleasure seeing you again today! ? ?------------------------------------------------------------------------ ?Gastroesophageal Reflux Induced Respiratory Disease and Laryngopharyngeal Reflux (LPR): ?Gastroesophageal reflux disease (GERD) is a condition where the contents of the stomach reflux or back up into the esophagus or swallowing tube.  This can result in a variety of clinical symptoms including classic symptoms and atypical symptoms.  Classic symptoms of GERD include: heartburn, chest pain, acid taste in the mouth, and difficulty in swallowing.  Atypical symptoms of GERD include laryngopharyngeal reflux (LPR) and asthma. ? ?LPR occurs when stomach reflux comes all the way up to the throat.  Clinical symptoms include hoarseness, raspy voice, laryngitis, throat clearing, postnasal drip, mucus stuck in the throat, a sensation of a lump in the throat, sore throat, and cough.  Most patients with LPR do not have classic symptoms of GERD. ? ?Asthma can also be triggered by GERD.  The acid stomach fluid can stimulate nerve fibers in the esophagus which can cause an increase in bronchial muscle tone and narrowing of the airways.  Acid stomach  contents may also reflux into the trachea and bronchi of the lungs where it can trigger an asthma attack.  Many people with GERD triggered asthma do not have classic symptoms of GERD. ? ?Diagnosis of LPR and GERD induced asthma is frequently made from a typical history and response to medications.  It may take several months of medications to see a good response.  Occasionally, a 24-hour esophageal pH probe study must be performed. ? ?Treatment of GERD/LPR includes: ?  ?Modification of diet and lifestyle ?Stop smoking ?Avoid overeating and lose weight ?Avoid acidic and fatty foods, chocolate, onions, garlic, peppermint ?Elevate the head of your bed 6 to 8 inches with blocks or wedge ?Medications ?Zantac, Pepcid, Axid, Tagamet ?Prilosec, Prevacid, Aciphex, Protonix, Nexium ?Surgery ? ? ? ? ? ?

## 2021-11-16 ENCOUNTER — Other Ambulatory Visit: Payer: Self-pay | Admitting: Internal Medicine

## 2022-01-12 NOTE — Progress Notes (Signed)
FOLLOW UP Date of Service/Encounter:  01/16/22   Subjective:  [name] (DOB: 1960-09-06) is a 61 y.o. female [facility] in re-evaluation of the following: Asthma, nonallergic rhinitis, reflux History obtained from: chart review and patient.  For Review, LV was on 09/16/21  with Dr.Keniesha Adderly seen for routine follow-up.  Today presents for follow-up. She continues to cough-triggered by [facility] when it is turned on her in the car.  When she is sleeping at night, she also coughs consistently.  This is better if she lays flat.   She coughs up mucus, and mostly when she coughs it is due to trying to clear her throat/upper chest. She does continue to have exercise intolerance. She can't go upstairs without getting out of breath.  A She is only using the rescue inhaler for coughing spells and it does help.  Only uses twice a month.  She does use her Breztri 2 puffs twice a day.  She continues to have some drainage, and is using her flonase and astelin nasal sprays as prescribed.   She is taking her medication for reflux only once a day.  She has reflux symptoms about twice a day.  She stopped taking montelukast because she felt it was making her cough worse.  She stopped once month ago and does not notice much difference.   ----------------------------------------------------------  Pertinent history/diagnostics:   -evaluated by [facility] allergy in 2020.  At that time environmental allergy testing was negative.  She was continued on her Advair 500 Diskus 1 puff twice a day. She also reported uncontrolled reflux and was told to continue Pepcid 20 mg twice daily.  However at initial visit she reports she is not taking anything for reflux. A chest x-ray at that time was read as normal. -05/06/2021 spirometry (pre-/post) showed no overt obstruction without bronchodilator response-however had taken albuterol 1 hour prior to testing - 05/06/2021 SPT  environmental panel negative, intradermal's negative -CBC differential ordered and IgE ordered but not obtained-most recent AEC on 12/10/18--200 We have switched her from Advair 500 to Breztri  Allergies as of 01/16/2022   No Known Allergies      Medication List        Accurate as of January 16, 2022  6:51 PM. If you have any questions, ask your nurse or doctor.          STOP taking these medications    montelukast 10 MG tablet Commonly known as: Singulair       TAKE these medications    albuterol 108 (90 Base) MCG/ACT inhaler Commonly known as: VENTOLIN HFA Inhale 2 puffs into the lungs every 6 (six) hours as needed for wheezing or shortness of breath.   azelastine 0.1 % nasal spray Commonly known as: ASTELIN Place 2 sprays into both nostrils at bedtime. Use in each nostril as directed   Breztri Aerosphere 160-9-4.8 MCG/ACT Aero Generic drug: Budeson-Glycopyrrol-Formoterol Inhale 2 puffs into the lungs in the morning and at bedtime.   fluticasone 50 MCG/ACT nasal spray Commonly known as: FLONASE Use 2 spray(s) in each nostril once daily   hydrochlorothiazide 25 MG tablet Commonly known as: HYDRODIURIL Take 25 mg by mouth daily.   ipratropium 0.03 % nasal spray Commonly known as: ATROVENT Place 1 spray into both nostrils daily as needed for rhinitis (drainage).   levothyroxine 50 MCG tablet Commonly known as: SYNTHROID Take 50 mcg by mouth daily before breakfast.   methocarbamol 500 MG tablet Commonly  known as: ROBAXIN Take 1-2 tablets (500-1,000 mg total) by mouth every 6 (six) hours as needed for muscle spasms.   Olopatadine HCl 0.2 % Soln Commonly known as: Pataday Place 1 drop into both eyes 1 day or 1 dose.   omeprazole 40 MG capsule Commonly known as: PRILOSEC Take 1 capsule (40 mg total) by mouth in the morning and at bedtime.   ursodiol 300 MG capsule Commonly known as: ACTIGALL Take 300 mg by mouth 2 (two) times daily.       Past  Medical History:  Diagnosis Date   Arthritis    Asthma    Depression    Hyperparathyroidism (HCC) 01/22/2014   Hypothyroidism    Vitamin D insufficiency 01/22/2014   Past Surgical History:  Procedure Laterality Date   EXTERNAL FIXATION LEG Right 04/29/2013   Procedure: EXTERNAL FIXATION LEG;  Surgeon: Budd Palmer, MD;  Location: [facility];  Service: Orthopedics;  Laterality: Right;   ORIF PELVIC FRACTURE N/A 04/29/2013   Procedure: OPEN REDUCTION INTERNAL FIXATION (ORIF) PELVIC FRACTURE;  Surgeon: Budd Palmer, MD;  Location: [facility];  Service: Orthopedics;  Laterality: N/A;   ORIF TIBIA FRACTURE Right 01/20/2014   Procedure: OPEN REDUCTION INTERNAL FIXATION (ORIF) RIGHT PROXIMAL TIBIA FRACTURE/HARDWARE REMOVAL;  Surgeon: Budd Palmer, MD;  Location: [facility];  Service: Orthopedics;  Laterality: Right;   TIBIA IM NAIL INSERTION Right 05/01/2013   Procedure: INTRAMEDULLARY (IM) NAIL RIGHT TIBIAL;  Surgeon: Budd Palmer, MD;  Location: [facility];  Service: Orthopedics;  Laterality: Right;   Otherwise, there have been no changes to her past medical history, surgical history, family history, or social history.  ROS: All others negative except as noted per HPI.   Objective:  BP 120/68   Pulse 63   Temp 98.6 F (37 C) (Temporal)   Resp 19   Ht 5\' 3"  (1.6 m)   Wt 182 lb 11.2 oz (82.9 kg)   SpO2 99%   BMI 32.36 kg/m  Body mass index is 32.36 kg/m. Physical Exam: General Appearance:  Alert, cooperative, no distress, appears stated age  Head:  Normocephalic, without obvious abnormality, atraumatic  Eyes:  Conjunctiva clear, EOM's intact  Nose: Nares normal, hypertrophic turbinates, normal mucosa, and no visible anterior polyps  Throat: Lips, tongue normal; poor dental hygiene normal posterior oropharynx  Neck: Supple, symmetrical  Lungs:   clear to auscultation bilaterally, Respirations unlabored, no coughing  Heart:  regular rate and rhythm and no murmur, Appears well perfused   Extremities: No edema  Skin: Skin color, texture, turgor normal, no rashes or lesions on visualized portions of skin  Neurologic: No gross deficits    Spirometry:  Tracings reviewed. Her effort: Good reproducible efforts. FVC: 1.55L FEV1: 1.27L, 63% predicted FEV1/FVC ratio: 91% Interpretation: Spirometry consistent with possible restrictive disease.  Nonobstructive pattern on ratio, technique poor. Please see scanned spirometry results for details.  Assessment/Plan  [name] continues to have chronic cough-suspect the majority of it is due to upper airway cough based on her description of symptoms.  Her reflux is uncontrolled, reporting heartburn symptoms at least twice per day despite taking omeprazole daily. She does continue to have asthma symptoms mostly with exercise intolerance.  We have talked about obtaining labs to see if she will qualify for an asthma biologic.   Severe Persistent Asthma- Daily meds:  Breztri 2 puffs twice a day every day Stop Singulair (montelukast)  As needed:  Albuterol (Proair/Ventolin) 2 puffs every 4-6 hours as needed and  2 puffs 15 minutes prior to exercise if you have symptoms with activity 3. Labs:   A.CBC with differential, total IgE to assess candidacy for injectable asthma medications  Chronic Rhinitis- Nonallergic: Daily meds:  A. Flonase (fluticasone) 2 sprays in each nostril in the morning. Best results if used daily. B. Astelin (Azelastine) 2 sprays in each nostril at night    C. Atrovent (ipatropium) 1 spray each nostril daily as needed - ONLY IF YOU FEEL LIKE YOU HAVE DRAINAGE 2. As needed:  A. Pataday (Olopatadine) eye drops  Reflux:  Increase omeprazole 40 mg to twice a day 2.  lifestyle modification  Follow-up in 3 months, sooner if needed.  It was pleasure seeing you again today!  Tonny Bollman, MD  [facility]

## 2022-01-16 ENCOUNTER — Other Ambulatory Visit: Payer: Self-pay | Admitting: Internal Medicine

## 2022-01-16 ENCOUNTER — Ambulatory Visit (INDEPENDENT_AMBULATORY_CARE_PROVIDER_SITE_OTHER): Payer: Medicare Other | Admitting: Internal Medicine

## 2022-01-16 ENCOUNTER — Encounter: Payer: Self-pay | Admitting: Internal Medicine

## 2022-01-16 VITALS — BP 120/68 | HR 63 | Temp 98.6°F | Resp 19 | Ht 63.0 in | Wt 182.7 lb

## 2022-01-16 DIAGNOSIS — J310 Chronic rhinitis: Secondary | ICD-10-CM | POA: Diagnosis not present

## 2022-01-16 DIAGNOSIS — J4550 Severe persistent asthma, uncomplicated: Secondary | ICD-10-CM

## 2022-01-16 DIAGNOSIS — K219 Gastro-esophageal reflux disease without esophagitis: Secondary | ICD-10-CM

## 2022-01-16 MED ORDER — AZELASTINE HCL 0.1 % NA SOLN: 2.0000 | Freq: Every evening | NASAL | 3 refills | Status: DC

## 2022-01-16 MED ORDER — FLUTICASONE PROPIONATE 50 MCG/ACT NA SUSP: NASAL | 3 refills | Status: DC

## 2022-01-16 MED ORDER — IPRATROPIUM BROMIDE 0.03 % NA SOLN: 1.0000 | Freq: Every day | NASAL | 3 refills | Status: DC | PRN

## 2022-01-16 MED ORDER — BREZTRI AEROSPHERE 160-9-4.8 MCG/ACT IN AERO: 2.0000 | INHALATION_SPRAY | Freq: Two times a day (BID) | RESPIRATORY_TRACT | 2 refills | Status: DC

## 2022-01-16 MED ORDER — OMEPRAZOLE 40 MG PO CPDR: 40.0000 mg | DELAYED_RELEASE_CAPSULE | Freq: Two times a day (BID) | ORAL | 3 refills | Status: DC

## 2022-01-16 NOTE — Patient Instructions (Addendum)
Severe Persistent Asthma- Daily meds:  Breztri 2 puffs twice a day every day Stop Singulair (montelukast)  As needed:  Albuterol (Proair/Ventolin) 2 puffs every 4-6 hours as needed and 2 puffs 15 minutes prior to exercise if you have symptoms with activity 3. Labs:   A.CBC with differential, total IgE to assess candidacy for injectable asthma medications  Chronic Rhinitis- Nonallergic: Daily meds:  A. Flonase (fluticasone) 2 sprays in each nostril in the morning. Best results if used daily. B. Astelin (Azelastine) 2 sprays in each nostril at night    C. Atrovent (ipatropium) 1 spray each nostril daily as needed - ONLY IF YOU FEEL LIKE YOU HAVE DRAINAGE 2. As needed:  A. Pataday (Olopatadine) eye drops  Reflux:  Increase omeprazole 40 mg to twice a day 2.  lifestyle modification  Follow-up in 3 months, sooner if needed.  It was pleasure seeing you again today!  ------------------------------------------------------------------------ Gastroesophageal Reflux Induced Respiratory Disease and Laryngopharyngeal Reflux (LPR): Gastroesophageal reflux disease (GERD) is a condition where the contents of the stomach reflux or back up into the esophagus or swallowing tube.  This can result in a variety of clinical symptoms including classic symptoms and atypical symptoms.  Classic symptoms of GERD include: heartburn, chest pain, acid taste in the mouth, and difficulty in swallowing.  Atypical symptoms of GERD include laryngopharyngeal reflux (LPR) and asthma.  LPR occurs when stomach reflux comes all the way up to the throat.  Clinical symptoms include hoarseness, raspy voice, laryngitis, throat clearing, postnasal drip, mucus stuck in the throat, a sensation of a lump in the throat, sore throat, and cough.  Most patients with LPR do not have classic symptoms of GERD.  Asthma can also be triggered by GERD.  The acid stomach fluid can stimulate nerve fibers in the esophagus which can cause an  increase in bronchial muscle tone and narrowing of the airways.  Acid stomach contents may also reflux into the trachea and bronchi of the lungs where it can trigger an asthma attack.  Many people with GERD triggered asthma do not have classic symptoms of GERD.  Diagnosis of LPR and GERD induced asthma is frequently made from a typical history and response to medications.  It may take several months of medications to see a good response.  Occasionally, a 24-hour esophageal pH probe study must be performed.  Treatment of GERD/LPR includes:   Modification of diet and lifestyle Stop smoking Avoid overeating and lose weight Avoid acidic and fatty foods, chocolate, onions, garlic, peppermint Elevate the head of your bed 6 to 8 inches with blocks or wedge Medications Zantac, Pepcid, Axid, Tagamet Prilosec, Prevacid, Aciphex, Protonix, Nexium Surgery

## 2022-01-18 LAB — CBC WITH DIFFERENTIAL/PLATELET
Basophils Absolute: 0 10*3/uL (ref 0.0–0.2)
Basos: 0 %
EOS (ABSOLUTE): 0.1 10*3/uL (ref 0.0–0.4)
Eos: 1 %
Hematocrit: 37.3 % (ref 34.0–46.6)
Hemoglobin: 12.4 g/dL (ref 11.1–15.9)
Immature Grans (Abs): 0 10*3/uL (ref 0.0–0.1)
Immature Granulocytes: 0 %
Lymphocytes Absolute: 5.3 10*3/uL — ABNORMAL HIGH (ref 0.7–3.1)
Lymphs: 57 %
MCH: 27.6 pg (ref 26.6–33.0)
MCHC: 33.2 g/dL (ref 31.5–35.7)
MCV: 83 fL (ref 79–97)
Monocytes Absolute: 0.5 10*3/uL (ref 0.1–0.9)
Monocytes: 6 %
Neutrophils Absolute: 3.3 10*3/uL (ref 1.4–7.0)
Neutrophils: 36 %
Platelets: 336 10*3/uL (ref 150–450)
RBC: 4.5 x10E6/uL (ref 3.77–5.28)
RDW: 13.7 % (ref 11.7–15.4)
WBC: 9.3 10*3/uL (ref 3.4–10.8)

## 2022-01-18 LAB — ALLERGENS W/TOTAL IGE AREA 2

## 2022-01-23 NOTE — Progress Notes (Signed)
Please let [name] know that her allergy testing was again negative.  She would only be a candidate for Dorothea Ogle which is an injectable asthma medication that she would get once per month to see if it helps with her breathing.  We can start the paperwork now, or wait till her next follow-up.  Let me know if she is interested.

## 2022-01-24 ENCOUNTER — Telehealth: Payer: Self-pay | Admitting: *Deleted

## 2022-01-24 NOTE — Telephone Encounter (Signed)
Called patient and advised approval and submit to [employer] for Tezspire. Will reach out to patient once delivery set to make appt to start therapy

## 2022-01-24 NOTE — Telephone Encounter (Signed)
-----   Message from Maurine Simmering, CMA sent at 01/23/2022 11:16 AM EDT ----- Regarding: [name] Per Dr. Maurine Minister please submit to insurance for Tezspire once a month for asthma. I mailed info to pt.

## 2022-02-02 ENCOUNTER — Ambulatory Visit (INDEPENDENT_AMBULATORY_CARE_PROVIDER_SITE_OTHER): Payer: Medicare Other

## 2022-02-02 DIAGNOSIS — J4550 Severe persistent asthma, uncomplicated: Secondary | ICD-10-CM

## 2022-02-02 MED ORDER — TEZEPELUMAB-EKKO 210 MG/1.91ML ~~LOC~~ SOSY
210.0000 mg | PREFILLED_SYRINGE | SUBCUTANEOUS | Status: AC
  Administered 2022-02-02 – 2024-08-15 (×29): 210 mg via SUBCUTANEOUS

## 2022-02-02 NOTE — Progress Notes (Signed)
Patient started Tezspire 210 mg today with no problems. Instructions given. She will continue her Tezspire every 28 days.

## 2022-03-02 ENCOUNTER — Ambulatory Visit (INDEPENDENT_AMBULATORY_CARE_PROVIDER_SITE_OTHER): Payer: Medicare Other

## 2022-03-02 DIAGNOSIS — J4550 Severe persistent asthma, uncomplicated: Secondary | ICD-10-CM

## 2022-03-30 ENCOUNTER — Ambulatory Visit (INDEPENDENT_AMBULATORY_CARE_PROVIDER_SITE_OTHER): Payer: Medicare Other

## 2022-03-30 DIAGNOSIS — J4550 Severe persistent asthma, uncomplicated: Secondary | ICD-10-CM | POA: Diagnosis not present

## 2022-04-21 ENCOUNTER — Ambulatory Visit: Payer: Medicare Other | Admitting: Internal Medicine

## 2022-04-27 ENCOUNTER — Ambulatory Visit (INDEPENDENT_AMBULATORY_CARE_PROVIDER_SITE_OTHER): Payer: Medicare Other

## 2022-04-27 DIAGNOSIS — J4550 Severe persistent asthma, uncomplicated: Secondary | ICD-10-CM

## 2022-04-30 NOTE — Progress Notes (Signed)
FOLLOW UP Date of Service/Encounter:  05/01/22   Subjective:  [name] (DOB: [birth year]) is a 61 y.o. female who [facility] in re-evaluation of the following:  Asthma, nonallergic rhinitis, reflux History obtained from: chart review and patient.  For Review, LV was on 01/16/22  with Dr.Shahrukh Pasch seen for routine follow-up. At that visit she continued to report constant coughing and shortness of breath despite using Breztri 2 puffs twice a day.  She continued to report significant reflux despite taking her reflux medication once a day.  FEV1 63% with nonobstructive ratio, but poor technique. Biologics labs were ordered and Dorothea Ogle was started. We increased her omeprazole 40 mg to BID.  ---------------------- Today presents for follow-up. She is doing better since last visit.  She is no longer having daytime symptoms since last visit. However, she is having cough when she lays down at night.  Improved by using her albuterol. She is okay if she lays flat, but will cough is sitting up. Occasionally will get SOB with walking, but much improved from her previous visit and since starting Tezspire She denies heart palpitations or lower extremity edema. She is using nasal sprays - flonase. She is not using azelastine because it tastes nasty. She is taking omeprazole twice daily.  She is not coughing after she eats. However, eating dinner at 8 PM.  ------------------ Pertinent history/diagnostics:  Reflux:  Not controlled with omeprzole 40 mg daily, increased to twice daily.  Cough and SOB/asthma:  2020 chest x-ray at that time was read as normal. -05/06/2021 spirometry (pre-/post) showed no overt obstruction without bronchodilator response-however had taken albuterol 1 hour prior to testing - 05/06/2021 SPT environmental panel negative, intradermal's negative -Biologics labs: 2023 IgE 56, negative environmental panel, AEC 100.   Teszspire started  02/02/22 and symptoms improved. Still coughing some at night with lying down thought more related to reflux.    Allergies as of 05/01/2022   Not on File      Medication List        Accurate as of May 01, 2022  5:02 PM. If you have any questions, ask your nurse or doctor.          albuterol 108 (90 Base) MCG/ACT inhaler Commonly known as: VENTOLIN HFA Inhale 2 puffs into the lungs every 6 (six) hours as needed for wheezing or shortness of breath.   azelastine 0.1 % nasal spray Commonly known as: ASTELIN Place 2 sprays into both nostrils at bedtime. Use in each nostril as directed   Breztri Aerosphere 160-9-4.8 MCG/ACT Aero Generic drug: Budeson-Glycopyrrol-Formoterol Inhale 2 puffs into the lungs in the morning and at bedtime.   famotidine 40 MG tablet Commonly known as: PEPCID Take 1 tablet (40 mg total) by mouth at bedtime. Started by: Verlee Monte, MD   fluticasone 50 MCG/ACT nasal spray Commonly known as: FLONASE Use 2 spray(s) in each nostril once daily   hydrochlorothiazide 25 MG tablet Commonly known as: HYDRODIURIL Take 25 mg by mouth daily.   ipratropium 0.03 % nasal spray Commonly known as: ATROVENT Place 1 spray into both nostrils daily as needed for rhinitis (drainage).   levothyroxine 50 MCG tablet Commonly known as: SYNTHROID Take 50 mcg by mouth daily before breakfast.   methocarbamol 500 MG tablet Commonly known as: ROBAXIN Take 1-2 tablets (500-1,000 mg total) by mouth every 6 (six) hours as needed for muscle spasms.   Olopatadine HCl 0.2 % Soln Commonly known as: Best boy  1 drop into both eyes 1 day or 1 dose.   omeprazole 40 MG capsule Commonly known as: PRILOSEC Take 1 capsule (40 mg total) by mouth in the morning and at bedtime.   ursodiol 300 MG capsule Commonly known as: ACTIGALL Take 300 mg by mouth 2 (two) times daily.       Past Medical History:  Diagnosis Date   Arthritis    Asthma    Depression     Hyperparathyroidism (HCC) 01/22/2014   Hypothyroidism    Vitamin D insufficiency 01/22/2014   Past Surgical History:  Procedure Laterality Date   EXTERNAL FIXATION LEG Right 04/29/2013   Procedure: EXTERNAL FIXATION LEG;  Surgeon: Budd Palmer, MD;  Location: [facility];  Service: Orthopedics;  Laterality: Right;   ORIF PELVIC FRACTURE N/A 04/29/2013   Procedure: OPEN REDUCTION INTERNAL FIXATION (ORIF) PELVIC FRACTURE;  Surgeon: Budd Palmer, MD;  Location: [facility];  Service: Orthopedics;  Laterality: N/A;   ORIF TIBIA FRACTURE Right 01/20/2014   Procedure: OPEN REDUCTION INTERNAL FIXATION (ORIF) RIGHT PROXIMAL TIBIA FRACTURE/HARDWARE REMOVAL;  Surgeon: Budd Palmer, MD;  Location: [facility];  Service: Orthopedics;  Laterality: Right;   TIBIA IM NAIL INSERTION Right 05/01/2013   Procedure: INTRAMEDULLARY (IM) NAIL RIGHT TIBIAL;  Surgeon: Budd Palmer, MD;  Location: [facility];  Service: Orthopedics;  Laterality: Right;   Otherwise, there have been no changes to her past medical history, surgical history, family history, or social history.  ROS: All others negative except as noted per HPI.   Objective:  BP 112/76 (BP Location: Left Arm, Patient Position: Sitting, Cuff Size: Normal)   Pulse 84   Temp 97.8 F (36.6 C) (Temporal)   Resp 20   Wt 180 lb 3.2 oz (81.7 kg)   SpO2 99%   BMI 31.92 kg/m  Body mass index is 31.92 kg/m. Physical Exam: General Appearance:  Alert, cooperative, no distress, appears stated age  Head:  Normocephalic, without obvious abnormality, atraumatic  Eyes:  Conjunctiva clear, EOM's intact  Nose: Nares normal, hypertrophic turbinates, normal mucosa, no visible anterior polyps, and septum midline  Throat: Lips, tongue normal; teeth and gums normal, normal posterior oropharynx  Neck: Supple, symmetrical  Lungs:   clear to auscultation bilaterally, Respirations unlabored, no coughing  Heart:  regular rate and rhythm and no murmur, Appears well perfused   Extremities: No edema  Skin: Skin color, texture, turgor normal, no rashes or lesions on visualized portions of skin  Neurologic: No gross deficits   Spirometry:  Tracings reviewed. Her effort: Good reproducible efforts. FVC: 2.0L FEV1: 1.43L, 71% predicted FEV1/FVC ratio: 90% Interpretation: Spirometry consistent with normal pattern.  Improved from previous visit. Please see scanned spirometry results for details.  Assessment/Plan   Severe Persistent Asthma-improved Daily meds:  Breztri 2 puffs twice a day every day As needed:  Albuterol (Proair/Ventolin) 2 puffs every 4-6 hours as needed and 2 puffs 15 minutes prior to exercise if you have symptoms with activity 3. Continue Tezspire once monthly  Chronic Rhinitis- Nonallergic: controlled Daily meds:  A. Flonase (fluticasone) 2 sprays in each nostril in the morning. Best results if used daily. B. Astelin (Azelastine) 2 sprays in each nostril at night    C. Atrovent (ipatropium) 1 spray each nostril daily as needed - ONLY IF YOU FEEL LIKE YOU HAVE DRAINAGE 2. As needed:  A. Pataday (Olopatadine) eye drops  Reflux: partially controlled Omeprazole 40 mg to twice a day Famotidine 40 mg nightly 2.  lifestyle modification  Follow-up in 6 months, sooner if needed.  It was pleasure seeing you again today!  Tonny Bollman, MD  [facility]

## 2022-05-01 ENCOUNTER — Ambulatory Visit (INDEPENDENT_AMBULATORY_CARE_PROVIDER_SITE_OTHER): Payer: Medicare Other | Admitting: Internal Medicine

## 2022-05-01 ENCOUNTER — Encounter: Payer: Self-pay | Admitting: Internal Medicine

## 2022-05-01 VITALS — BP 112/76 | HR 84 | Temp 97.8°F | Resp 20 | Wt 180.2 lb

## 2022-05-01 DIAGNOSIS — K219 Gastro-esophageal reflux disease without esophagitis: Secondary | ICD-10-CM

## 2022-05-01 DIAGNOSIS — J4550 Severe persistent asthma, uncomplicated: Secondary | ICD-10-CM

## 2022-05-01 DIAGNOSIS — J310 Chronic rhinitis: Secondary | ICD-10-CM

## 2022-05-01 MED ORDER — FAMOTIDINE 40 MG PO TABS: 40.0000 mg | ORAL_TABLET | Freq: Every day | ORAL | 5 refills | Status: DC

## 2022-05-01 NOTE — Patient Instructions (Addendum)
Severe Persistent Asthma- Daily meds:  Breztri 2 puffs twice a day every day As needed:  Albuterol (Proair/Ventolin) 2 puffs every 4-6 hours as needed and 2 puffs 15 minutes prior to exercise if you have symptoms with activity 3. Continue Tezspire once monthly  Chronic Rhinitis- Nonallergic: Daily meds:  A. Flonase (fluticasone) 2 sprays in each nostril in the morning. Best results if used daily. B. Astelin (Azelastine) 2 sprays in each nostril at night    C. Atrovent (ipatropium) 1 spray each nostril daily as needed - ONLY IF YOU FEEL LIKE YOU HAVE DRAINAGE 2. As needed:  A. Pataday (Olopatadine) eye drops  Reflux:  Omeprazole 40 mg to twice a day Famotidine 40 mg nightly 2.  lifestyle modification  Follow-up in 6 months, sooner if needed.  It was pleasure seeing you again today!  ------------------------------------------------------------------------ Gastroesophageal Reflux Induced Respiratory Disease and Laryngopharyngeal Reflux (LPR): Gastroesophageal reflux disease (GERD) is a condition where the contents of the stomach reflux or back up into the esophagus or swallowing tube.  This can result in a variety of clinical symptoms including classic symptoms and atypical symptoms.  Classic symptoms of GERD include: heartburn, chest pain, acid taste in the mouth, and difficulty in swallowing.  Atypical symptoms of GERD include laryngopharyngeal reflux (LPR) and asthma.  LPR occurs when stomach reflux comes all the way up to the throat.  Clinical symptoms include hoarseness, raspy voice, laryngitis, throat clearing, postnasal drip, mucus stuck in the throat, a sensation of a lump in the throat, sore throat, and cough.  Most patients with LPR do not have classic symptoms of GERD.  Asthma can also be triggered by GERD.  The acid stomach fluid can stimulate nerve fibers in the esophagus which can cause an increase in bronchial muscle tone and narrowing of the airways.  Acid stomach contents  may also reflux into the trachea and bronchi of the lungs where it can trigger an asthma attack.  Many people with GERD triggered asthma do not have classic symptoms of GERD.  Diagnosis of LPR and GERD induced asthma is frequently made from a typical history and response to medications.  It may take several months of medications to see a good response.  Occasionally, a 24-hour esophageal pH probe study must be performed.  Treatment of GERD/LPR includes:   Modification of diet and lifestyle Stop smoking Avoid overeating and lose weight Avoid acidic and fatty foods, chocolate, onions, garlic, peppermint Elevate the head of your bed 6 to 8 inches with blocks or wedge Medications Zantac, Pepcid, Axid, Tagamet Prilosec, Prevacid, Aciphex, Protonix, Nexium Surgery

## 2022-05-25 ENCOUNTER — Ambulatory Visit (INDEPENDENT_AMBULATORY_CARE_PROVIDER_SITE_OTHER): Payer: Medicare Other

## 2022-05-25 DIAGNOSIS — J4550 Severe persistent asthma, uncomplicated: Secondary | ICD-10-CM

## 2022-06-22 ENCOUNTER — Ambulatory Visit (INDEPENDENT_AMBULATORY_CARE_PROVIDER_SITE_OTHER): Payer: Medicare Other

## 2022-06-22 DIAGNOSIS — J4550 Severe persistent asthma, uncomplicated: Secondary | ICD-10-CM

## 2022-07-20 ENCOUNTER — Ambulatory Visit (INDEPENDENT_AMBULATORY_CARE_PROVIDER_SITE_OTHER): Payer: Medicare Other

## 2022-07-20 DIAGNOSIS — J4550 Severe persistent asthma, uncomplicated: Secondary | ICD-10-CM | POA: Diagnosis not present

## 2022-07-26 ENCOUNTER — Other Ambulatory Visit: Payer: Self-pay | Admitting: Internal Medicine

## 2022-09-11 ENCOUNTER — Ambulatory Visit: Payer: 59

## 2022-09-12 ENCOUNTER — Ambulatory Visit: Payer: 59

## 2022-09-13 ENCOUNTER — Ambulatory Visit (INDEPENDENT_AMBULATORY_CARE_PROVIDER_SITE_OTHER): Payer: 59

## 2022-09-13 DIAGNOSIS — J4550 Severe persistent asthma, uncomplicated: Secondary | ICD-10-CM | POA: Diagnosis not present

## 2022-10-11 ENCOUNTER — Ambulatory Visit (INDEPENDENT_AMBULATORY_CARE_PROVIDER_SITE_OTHER): Payer: 59

## 2022-10-11 DIAGNOSIS — J4550 Severe persistent asthma, uncomplicated: Secondary | ICD-10-CM | POA: Diagnosis not present

## 2022-11-07 NOTE — Progress Notes (Unsigned)
FOLLOW UP Date of Service/Encounter:  11/07/22   Subjective:  [name] (DOB: 06/09/1961) is a 62 y.o. female who [facility] in re-evaluation of the following: Asthma, non-allergic rhinitis, reflux History obtained from: chart review and patient.  For Review, LV was on 05/02/23  with Dr.Jamyah Folk seen for routine follow-up. See below for summary of history and diagnostics.  Therapeutic plans/changes recommended: Fev1 71%. Reflux stilll uncontrolled. Coughing when lying down at night. Improved daytime cough and SOB symptoms with tezspire.   Pertinent history/diagnostics:  Reflux:  Not controlled with omeprazole 40 mg daily, increased to twice daily.  Cough and SOB/asthma:  2020 chest x-ray at that time was read as normal. -05/06/2021 spirometry (pre-/post) showed no overt obstruction without bronchodilator response-however had taken albuterol 1 hour prior to testing - 05/06/2021 SPT environmental panel negative, intradermal's negative -[facility] labs: 2023 IgE 56, negative environmental panel, AEC 100.   Teszspire started 02/02/22 and symptoms improved. Still coughing some at night with lying down thought more related to reflux.   Today presents for follow-up. She is doing well.  She is using Breztri twice a day. She is doing well with her injections of Tezspire.  No adverse reactions.  She is using her albuterol daily prior to going for a walk.  She is doing Psychologist, educational daily.  Occasionally will do it at home, and other times will go to a class.  Her husband is enjoying Zumba with her as well. She is using her nasal sprays daily and will occasionally use Pataday eyedrops.  She would like to have an allergy pill to take as she finds these helpful. She does report her reflux is controlled, but is taking omeprazole twice daily as well as famotidine at night. She has "not felt this good in a long time".  She would like to continue with current  plan.   Allergies as of 11/09/2022   Not on File      Medication List        Accurate as of November 07, 2022  9:51 PM. If you have any questions, ask your nurse or doctor.          albuterol 108 (90 Base) MCG/ACT inhaler Commonly known as: VENTOLIN HFA Inhale 2 puffs into the lungs every 6 (six) hours as needed for wheezing or shortness of breath.   azelastine 0.1 % nasal spray Commonly known as: ASTELIN Place 2 sprays into both nostrils at bedtime. Use in each nostril as directed   Breztri Aerosphere 160-9-4.8 MCG/ACT Aero Generic drug: Budeson-Glycopyrrol-Formoterol Inhale 2 puffs into the lungs in the morning and at bedtime.   famotidine 40 MG tablet Commonly known as: PEPCID Take 1 tablet (40 mg total) by mouth at bedtime.   fluticasone 50 MCG/ACT nasal spray Commonly known as: FLONASE SHAKE LIQUID AND USE 2 SPRAYS IN EACH NOSTRIL EVERY DAY   hydrochlorothiazide 25 MG tablet Commonly known as: HYDRODIURIL Take 25 mg by mouth daily.   ipratropium 0.03 % nasal spray Commonly known as: ATROVENT Place 1 spray into both nostrils daily as needed for rhinitis (drainage).   levothyroxine 50 MCG tablet Commonly known as: SYNTHROID Take 50 mcg by mouth daily before breakfast.   methocarbamol 500 MG tablet Commonly known as: ROBAXIN Take 1-2 tablets (500-1,000 mg total) by mouth every 6 (six) hours as needed for muscle spasms.   Olopatadine HCl 0.2 % Soln Commonly known as: Pataday Place 1 drop into both eyes 1 day or  1 dose.   omeprazole 40 MG capsule Commonly known as: PRILOSEC Take 1 capsule (40 mg total) by mouth in the morning and at bedtime.   ursodiol 300 MG capsule Commonly known as: ACTIGALL Take 300 mg by mouth 2 (two) times daily.       Past Medical History:  Diagnosis Date   Arthritis    Asthma    Depression    Hyperparathyroidism (HCC) 01/22/2014   Hypothyroidism    Vitamin D insufficiency 01/22/2014   Past Surgical History:  Procedure  Laterality Date   EXTERNAL FIXATION LEG Right 04/29/2013   Procedure: EXTERNAL FIXATION LEG;  Surgeon: Budd Palmer, MD;  Location: [facility] OR;  Service: Orthopedics;  Laterality: Right;   ORIF PELVIC FRACTURE N/A 04/29/2013   Procedure: OPEN REDUCTION INTERNAL FIXATION (ORIF) PELVIC FRACTURE;  Surgeon: Budd Palmer, MD;  Location: [facility];  Service: Orthopedics;  Laterality: N/A;   ORIF TIBIA FRACTURE Right 01/20/2014   Procedure: OPEN REDUCTION INTERNAL FIXATION (ORIF) RIGHT PROXIMAL TIBIA FRACTURE/HARDWARE REMOVAL;  Surgeon: Budd Palmer, MD;  Location: [facility] OR;  Service: Orthopedics;  Laterality: Right;   TIBIA IM NAIL INSERTION Right 05/01/2013   Procedure: INTRAMEDULLARY (IM) NAIL RIGHT TIBIAL;  Surgeon: Budd Palmer, MD;  Location: [facility] OR;  Service: Orthopedics;  Laterality: Right;   Otherwise, there have been no changes to her past medical history, surgical history, family history, or social history.  ROS: All others negative except as noted per HPI.   Objective:  There were no vitals taken for this visit. There is no height or weight on file to calculate BMI. Physical Exam: General Appearance:  Alert, cooperative, no distress, appears stated age  Head:  Normocephalic, without obvious abnormality, atraumatic  Eyes:  Conjunctiva clear, EOM's intact  Nose: Nares normal, normal mucosa and no visible anterior polyps  Throat: Lips, tongue normal; teeth and gums normal, normal posterior oropharynx  Neck: Supple, symmetrical  Lungs:   clear to auscultation bilaterally, Respirations unlabored, no coughing  Heart:  regular rate and rhythm and no murmur, Appears well perfused  Extremities: No edema  Skin: Skin color, texture, turgor normal and no rashes or lesions on visualized portions of skin  Neurologic: No gross deficits  Spirometry:  Tracings reviewed. Her effort: Good reproducible efforts. FVC: 2.05L FEV1: 1.50L, 75% predicted FEV1/FVC ratio: 91% Interpretation: Spirometry  consistent with normal pattern.  Please see scanned spirometry results for details.  Assessment/Plan   Severe Persistent Asthma-controlled Daily meds:  Breztri 2 puffs twice a day every day As needed:  Albuterol (Proair/Ventolin) 2 puffs every 4-6 hours as needed and 2 puffs 15 minutes prior to exercise if you have symptoms with activity 3. Continue Tezspire once monthly  Chronic Rhinitis- Nonallergic: controlled Daily meds:  A. Flonase (fluticasone) 2 sprays in each nostril in the morning. Best results if used daily. B. Astelin (Azelastine) 2 sprays in each nostril at night    C. Atrovent (ipatropium) 1 spray each nostril daily as needed - ONLY IF YOU FEEL LIKE YOU HAVE DRAINAGE 2. As needed:  A. Pataday (Olopatadine) eye drops B. Cetrizine 10 mg daily as needed.  Reflux: controlled Omeprazole 40 mg twice a day Famotidine 40 mg nightly 2.  lifestyle modification  Follow-up in 6 months, sooner if needed.  It was pleasure seeing you again today!  Tonny Bollman, MD  [facility]

## 2022-11-08 ENCOUNTER — Ambulatory Visit (INDEPENDENT_AMBULATORY_CARE_PROVIDER_SITE_OTHER): Payer: 59

## 2022-11-08 DIAGNOSIS — J4550 Severe persistent asthma, uncomplicated: Secondary | ICD-10-CM | POA: Diagnosis not present

## 2022-11-09 ENCOUNTER — Ambulatory Visit (INDEPENDENT_AMBULATORY_CARE_PROVIDER_SITE_OTHER): Payer: 59 | Admitting: Internal Medicine

## 2022-11-09 ENCOUNTER — Encounter: Payer: Self-pay | Admitting: Internal Medicine

## 2022-11-09 VITALS — BP 118/68 | HR 91 | Temp 97.8°F | Resp 18

## 2022-11-09 DIAGNOSIS — K219 Gastro-esophageal reflux disease without esophagitis: Secondary | ICD-10-CM

## 2022-11-09 DIAGNOSIS — J310 Chronic rhinitis: Secondary | ICD-10-CM | POA: Diagnosis not present

## 2022-11-09 DIAGNOSIS — J4550 Severe persistent asthma, uncomplicated: Secondary | ICD-10-CM

## 2022-11-09 MED ORDER — ALBUTEROL SULFATE HFA 108 (90 BASE) MCG/ACT IN AERS: 2.0000 | INHALATION_SPRAY | Freq: Four times a day (QID) | RESPIRATORY_TRACT | 2 refills | Status: DC | PRN

## 2022-11-09 MED ORDER — IPRATROPIUM BROMIDE 0.03 % NA SOLN: 1.0000 | Freq: Every day | NASAL | 5 refills | Status: DC | PRN

## 2022-11-09 MED ORDER — FAMOTIDINE 40 MG PO TABS: 40.0000 mg | ORAL_TABLET | Freq: Every day | ORAL | 5 refills | Status: DC

## 2022-11-09 MED ORDER — AZELASTINE HCL 0.1 % NA SOLN: 2.0000 | Freq: Every evening | NASAL | 5 refills | Status: DC

## 2022-11-09 MED ORDER — OMEPRAZOLE 40 MG PO CPDR: 40.0000 mg | DELAYED_RELEASE_CAPSULE | Freq: Two times a day (BID) | ORAL | 5 refills | Status: DC

## 2022-11-09 MED ORDER — BREZTRI AEROSPHERE 160-9-4.8 MCG/ACT IN AERO: 2.0000 | INHALATION_SPRAY | Freq: Two times a day (BID) | RESPIRATORY_TRACT | 5 refills | Status: DC

## 2022-11-09 MED ORDER — OLOPATADINE HCL 0.2 % OP SOLN: 1.0000 [drp] | Freq: Every day | OPHTHALMIC | 5 refills | Status: DC | PRN

## 2022-11-09 MED ORDER — FLUTICASONE PROPIONATE 50 MCG/ACT NA SUSP: NASAL | 1 refills | Status: DC

## 2022-11-09 NOTE — Patient Instructions (Addendum)
Severe Persistent Asthma- Daily meds:  Breztri 2 puffs twice a day every day As needed:  Albuterol (Proair/Ventolin) 2 puffs every 4-6 hours as needed and 2 puffs 15 minutes prior to exercise if you have symptoms with activity 3. Continue Tezspire once monthly  Chronic Rhinitis- Nonallergic: Daily meds:  A. Flonase (fluticasone) 2 sprays in each nostril in the morning. Best results if used daily. B. Astelin (Azelastine) 2 sprays in each nostril at night    C. Atrovent (ipatropium) 1 spray each nostril daily as needed - ONLY IF YOU FEEL LIKE YOU HAVE DRAINAGE 2. As needed:  A. Pataday (Olopatadine) eye drops B. Cetrizine 10 mg daily as needed.  Reflux:  Omeprazole 40 mg twice a day Famotidine 40 mg nightly 2.  lifestyle modification  Follow-up in 6 months, sooner if needed.  It was pleasure seeing you again today!  ------------------------------------------------------------------------ Gastroesophageal Reflux Induced Respiratory Disease and Laryngopharyngeal Reflux (LPR): Gastroesophageal reflux disease (GERD) is a condition where the contents of the stomach reflux or back up into the esophagus or swallowing tube.  This can result in a variety of clinical symptoms including classic symptoms and atypical symptoms.  Classic symptoms of GERD include: heartburn, chest pain, acid taste in the mouth, and difficulty in swallowing.  Atypical symptoms of GERD include laryngopharyngeal reflux (LPR) and asthma.  LPR occurs when stomach reflux comes all the way up to the throat.  Clinical symptoms include hoarseness, raspy voice, laryngitis, throat clearing, postnasal drip, mucus stuck in the throat, a sensation of a lump in the throat, sore throat, and cough.  Most patients with LPR do not have classic symptoms of GERD.  Asthma can also be triggered by GERD.  The acid stomach fluid can stimulate nerve fibers in the esophagus which can cause an increase in bronchial muscle tone and narrowing of the  airways.  Acid stomach contents may also reflux into the trachea and bronchi of the lungs where it can trigger an asthma attack.  Many people with GERD triggered asthma do not have classic symptoms of GERD.  Diagnosis of LPR and GERD induced asthma is frequently made from a typical history and response to medications.  It may take several months of medications to see a good response.  Occasionally, a 24-hour esophageal pH probe study must be performed.  Treatment of GERD/LPR includes:   Modification of diet and lifestyle Stop smoking Avoid overeating and lose weight Avoid acidic and fatty foods, chocolate, onions, garlic, peppermint Elevate the head of your bed 6 to 8 inches with blocks or wedge Medications Zantac, Pepcid, Axid, Tagamet Prilosec, Prevacid, Aciphex, Protonix, Nexium Surgery

## 2022-12-06 ENCOUNTER — Ambulatory Visit (INDEPENDENT_AMBULATORY_CARE_PROVIDER_SITE_OTHER): Payer: 59

## 2022-12-06 DIAGNOSIS — J4550 Severe persistent asthma, uncomplicated: Secondary | ICD-10-CM | POA: Diagnosis not present

## 2023-01-03 ENCOUNTER — Ambulatory Visit (INDEPENDENT_AMBULATORY_CARE_PROVIDER_SITE_OTHER): Payer: 59

## 2023-01-03 DIAGNOSIS — J4550 Severe persistent asthma, uncomplicated: Secondary | ICD-10-CM

## 2023-01-10 ENCOUNTER — Other Ambulatory Visit: Payer: Self-pay | Admitting: Internal Medicine

## 2023-01-31 ENCOUNTER — Ambulatory Visit: Payer: 59

## 2023-02-09 ENCOUNTER — Ambulatory Visit: Payer: 59

## 2023-02-13 ENCOUNTER — Ambulatory Visit: Payer: 59

## 2023-02-19 ENCOUNTER — Ambulatory Visit (INDEPENDENT_AMBULATORY_CARE_PROVIDER_SITE_OTHER): Payer: 59 | Admitting: Internal Medicine

## 2023-02-19 DIAGNOSIS — J4550 Severe persistent asthma, uncomplicated: Secondary | ICD-10-CM | POA: Diagnosis not present

## 2023-03-19 ENCOUNTER — Ambulatory Visit (INDEPENDENT_AMBULATORY_CARE_PROVIDER_SITE_OTHER): Payer: 59

## 2023-03-19 DIAGNOSIS — J4550 Severe persistent asthma, uncomplicated: Secondary | ICD-10-CM

## 2023-04-16 ENCOUNTER — Ambulatory Visit: Payer: 59

## 2023-04-16 DIAGNOSIS — J4550 Severe persistent asthma, uncomplicated: Secondary | ICD-10-CM

## 2023-05-14 ENCOUNTER — Ambulatory Visit: Payer: 59 | Admitting: *Deleted

## 2023-05-14 ENCOUNTER — Ambulatory Visit: Payer: 59 | Admitting: Internal Medicine

## 2023-05-14 DIAGNOSIS — J4550 Severe persistent asthma, uncomplicated: Secondary | ICD-10-CM | POA: Diagnosis not present

## 2023-05-17 ENCOUNTER — Ambulatory Visit (INDEPENDENT_AMBULATORY_CARE_PROVIDER_SITE_OTHER): Payer: 59 | Admitting: Internal Medicine

## 2023-05-17 ENCOUNTER — Encounter: Payer: Self-pay | Admitting: Internal Medicine

## 2023-05-17 VITALS — BP 122/76 | HR 73 | Temp 97.9°F | Resp 19

## 2023-05-17 DIAGNOSIS — J310 Chronic rhinitis: Secondary | ICD-10-CM

## 2023-05-17 DIAGNOSIS — K219 Gastro-esophageal reflux disease without esophagitis: Secondary | ICD-10-CM

## 2023-05-17 DIAGNOSIS — J4550 Severe persistent asthma, uncomplicated: Secondary | ICD-10-CM

## 2023-05-17 MED ORDER — MONTELUKAST SODIUM 10 MG PO TABS: 10.0000 mg | ORAL_TABLET | Freq: Every day | ORAL | 5 refills | Status: DC

## 2023-05-17 MED ORDER — IPRATROPIUM BROMIDE 0.03 % NA SOLN: 1.0000 | Freq: Every day | NASAL | 5 refills | Status: DC | PRN

## 2023-05-17 MED ORDER — OMEPRAZOLE 40 MG PO CPDR: 40.0000 mg | DELAYED_RELEASE_CAPSULE | Freq: Two times a day (BID) | ORAL | 5 refills | Status: DC

## 2023-05-17 MED ORDER — FLUTICASONE PROPIONATE 50 MCG/ACT NA SUSP: NASAL | 1 refills | Status: DC

## 2023-05-17 MED ORDER — FAMOTIDINE 40 MG PO TABS: 40.0000 mg | ORAL_TABLET | Freq: Every day | ORAL | 5 refills | Status: DC

## 2023-05-17 MED ORDER — AZELASTINE HCL 0.1 % NA SOLN: 2.0000 | Freq: Every evening | NASAL | 5 refills | Status: DC

## 2023-05-17 MED ORDER — BREZTRI AEROSPHERE 160-9-4.8 MCG/ACT IN AERO: 2.0000 | INHALATION_SPRAY | Freq: Two times a day (BID) | RESPIRATORY_TRACT | 5 refills | Status: DC

## 2023-05-17 NOTE — Patient Instructions (Addendum)
Severe Persistent Asthma- Daily meds:  Breztri 2 puffs twice a day every day As needed:  Albuterol (Proair/Ventolin) 2 puffs every 4-6 hours as needed and 2 puffs 15 minutes prior to exercise if you have symptoms with activity 3. Continue Tezspire once monthly  Chronic Rhinitis- Nonallergic: Daily meds:  A. Flonase (fluticasone) 2 sprays in each nostril in the morning. Best results if used daily. B. Astelin (Azelastine) 2 sprays in each nostril at night    C. Atrovent (ipatropium) 1 spray each nostril daily as needed - ONLY IF YOU FEEL LIKE YOU HAVE DRAINAGE 2. As needed:  A. Pataday (Olopatadine) eye drops B. Cetrizine 10 mg daily as needed.  Reflux:  Omeprazole 40 mg twice a day Famotidine 40 mg nightly 2.  lifestyle modification Referral to gastroenterology for help with reflux control  Follow-up in 6 months, sooner if needed.  It was pleasure seeing you again today!  ------------------------------------------------------------------------ Gastroesophageal Reflux Induced Respiratory Disease and Laryngopharyngeal Reflux (LPR): Gastroesophageal reflux disease (GERD) is a condition where the contents of the stomach reflux or back up into the esophagus or swallowing tube.  This can result in a variety of clinical symptoms including classic symptoms and atypical symptoms.  Classic symptoms of GERD include: heartburn, chest pain, acid taste in the mouth, and difficulty in swallowing.  Atypical symptoms of GERD include laryngopharyngeal reflux (LPR) and asthma.  LPR occurs when stomach reflux comes all the way up to the throat.  Clinical symptoms include hoarseness, raspy voice, laryngitis, throat clearing, postnasal drip, mucus stuck in the throat, a sensation of a lump in the throat, sore throat, and cough.  Most patients with LPR do not have classic symptoms of GERD.  Asthma can also be triggered by GERD.  The acid stomach fluid can stimulate nerve fibers in the esophagus which can  cause an increase in bronchial muscle tone and narrowing of the airways.  Acid stomach contents may also reflux into the trachea and bronchi of the lungs where it can trigger an asthma attack.  Many people with GERD triggered asthma do not have classic symptoms of GERD.  Diagnosis of LPR and GERD induced asthma is frequently made from a typical history and response to medications.  It may take several months of medications to see a good response.  Occasionally, a 24-hour esophageal pH probe study must be performed.  Treatment of GERD/LPR includes:   Modification of diet and lifestyle Stop smoking Avoid overeating and lose weight Avoid acidic and fatty foods, chocolate, onions, garlic, peppermint Elevate the head of your bed 6 to 8 inches with blocks or wedge Medications Zantac, Pepcid, Axid, Tagamet Prilosec, Prevacid, Aciphex, Protonix, Nexium Surgery

## 2023-05-17 NOTE — Progress Notes (Signed)
FOLLOW UP Date of Service/Encounter:  05/17/23  Subjective:  [name] (DOB: 1961-01-22) is a 62 y.o. female who [facility] in re-evaluation of the following: severe asthma, reflux, chronic rhinitis History obtained from: chart review and patient.  For Review, LV was on 11/09/22  with Dr.Denaya Horn seen for routine follow-up. See below for summary of history and diagnostics.  ----------------------------------------------------- Pertinent History/Diagnostics:  Reflux:  Not controlled with omeprazole 40 mg daily, increased to twice daily.  Current meds: Omeprazole 40 mg twice daily, added famotidine 40 mg nightly (11/09/22) Cough and SOB/asthma:  2020 chest x-ray at that time was read as normal. -05/06/2021 spirometry (pre-/post) showed no overt obstruction without bronchodilator response-however had taken albuterol 1 hour prior to testing - 05/06/2021 SPT environmental panel negative, intradermal's negative -[facility] labs: 2023 IgE 56, negative environmental panel, AEC 100.   Teszspire started 02/02/22 and symptoms improved. Still coughing some at night with lying down thought more related to reflux.  Current meds :Breztri 2 puffs BID, tezspire, albuterol PRN Chronic rhinitis Current meds: Flonase, azelastine nasal sprays and ipratropium nasal sprays, Zyrtec and Pataday as needed --------------------------------------------------- Today presents for follow-up. Discussed the use of AI scribe software for clinical note transcription with the patient, who gave verbal consent to proceed.  History of Present Illness   The patient, diagnosed with asthma and gastroesophageal reflux disease (GERD), reports persistent coughing triggered by cold weather. She uses an albuterol inhaler to manage these episodes, which she reports as effective in calming the cough. The patient is on a regimen of Breztri, administered as two puffs twice daily, and she  affirms its effectiveness in managing her asthma symptoms.  The patient also reports ongoing issues with GERD. She is currently on a regimen of omeprazole and famotidine, which she takes twice daily. Despite this, she experiences episodes of central chest pain, lasting a few minutes before subsiding. She finds relief from these episodes by taking Tums. The patient also reports significant fatigue, particularly when exerting herself, such as walking a block. She manages this fatigue with her albuterol inhaler, which she reports as effective.  The patient uses nasal sprays daily and finds them helpful. She has not seen a heart specialist for her fatigue and shortness of breath. She has not experienced palpitations or a sensation of her heart skipping beats. Denies lower extremity edema. Denies having seen a GI specialist.     All medications reviewed by clinical staff and updated in chart. No new pertinent medical or surgical history except as noted in HPI.  ROS: All others negative except as noted per HPI.   Objective:  BP 122/76 (BP Location: Right Arm, Patient Position: Sitting, Cuff Size: Normal)   Pulse 73   Temp 97.9 F (36.6 C) (Temporal)   Resp 19   SpO2 98%  There is no height or weight on file to calculate BMI. Physical Exam: General Appearance:  Alert, cooperative, no distress, appears stated age  Head:  Normocephalic, without obvious abnormality, atraumatic  Eyes:  Conjunctiva clear, EOM's intact  Ears EACs normal bilaterally and normal TMs bilaterally  Nose: Nares normal, normal mucosa and no visible anterior polyps  Throat: Lips, tongue normal; teeth and gums normal, normal posterior oropharynx  Neck: Supple, symmetrical  Lungs:   clear to auscultation bilaterally, Respirations unlabored, no coughing  Heart:  regular rate and rhythm and no murmur, Appears well perfused  Extremities: No edema  Skin: Skin color, texture, turgor normal and no  rashes or lesions on visualized  portions of skin  Neurologic: No gross deficits   Labs:  Lab Orders  No laboratory test(s) ordered today    Spirometry:  Tracings reviewed. Her effort: Good reproducible efforts. FVC: 1.83L FEV1: 1.41L, 71% predicted FEV1/FVC ratio: 0.77 Interpretation: Spirometry consistent with normal pattern.  Please see scanned spirometry results for details.  Assessment/Plan   Severe Persistent Asthma-improved on Tezspire, not at goal, suspect in part due to uncontrolled reflux Daily meds:  Breztri 2 puffs twice a day every day As needed:  Albuterol (Proair/Ventolin) 2 puffs every 4-6 hours as needed and 2 puffs 15 minutes prior to exercise if you have symptoms with activity 3. Continue Tezspire once monthly  Chronic Rhinitis- Nonallergic: at goal Daily meds:  A. Flonase (fluticasone) 2 sprays in each nostril in the morning. Best results if used daily. B. Astelin (Azelastine) 2 sprays in each nostril at night    C. Atrovent (ipatropium) 1 spray each nostril daily as needed - ONLY IF YOU FEEL LIKE YOU HAVE DRAINAGE 2. As needed:  A. Pataday (Olopatadine) eye drops B. Cetrizine 10 mg daily as needed.  Reflux: not at goal Omeprazole 40 mg twice a day Famotidine 40 mg nightly 2.  lifestyle modification Referral to gastroenterology for help with reflux control  Follow-up in 6 months, sooner if needed.  It was pleasure seeing you again today!  Other: none  Tonny Bollman, MD  [facility]

## 2023-05-23 ENCOUNTER — Telehealth: Payer: Self-pay

## 2023-05-23 NOTE — Telephone Encounter (Signed)
-----   Message from Verlee Monte sent at 05/17/2023  5:46 PM EST ----- Can we refer to GI for uncontrolled reflux despite highdose PPI and H2 blocker? Thanks

## 2023-05-23 NOTE — Telephone Encounter (Signed)
07/12/2023 @ 3:20 P.M. with Dr. Dorna Leitz   [facility] - [facility] [address] [place], [place] [address] Phone: [phone] Fax: [phone]  Pt is to bring photo ID, insurance cards and updated medication list. Pt is to arrive 15 mins early.    I called and informed the patient of this information.

## 2023-06-11 ENCOUNTER — Ambulatory Visit: Payer: 59

## 2023-07-19 ENCOUNTER — Ambulatory Visit (INDEPENDENT_AMBULATORY_CARE_PROVIDER_SITE_OTHER): Payer: 59

## 2023-07-19 DIAGNOSIS — J4550 Severe persistent asthma, uncomplicated: Secondary | ICD-10-CM | POA: Diagnosis not present

## 2023-08-16 ENCOUNTER — Ambulatory Visit: Payer: 59

## 2023-08-16 DIAGNOSIS — J4550 Severe persistent asthma, uncomplicated: Secondary | ICD-10-CM | POA: Diagnosis not present

## 2023-09-13 ENCOUNTER — Ambulatory Visit: Payer: 59

## 2023-09-18 ENCOUNTER — Ambulatory Visit

## 2023-09-20 ENCOUNTER — Ambulatory Visit

## 2023-09-20 DIAGNOSIS — J4550 Severe persistent asthma, uncomplicated: Secondary | ICD-10-CM | POA: Diagnosis not present

## 2023-10-17 ENCOUNTER — Ambulatory Visit (INDEPENDENT_AMBULATORY_CARE_PROVIDER_SITE_OTHER)

## 2023-10-17 DIAGNOSIS — J4550 Severe persistent asthma, uncomplicated: Secondary | ICD-10-CM

## 2023-10-18 ENCOUNTER — Ambulatory Visit

## 2023-11-14 ENCOUNTER — Ambulatory Visit: Payer: 59 | Admitting: Internal Medicine

## 2023-11-14 ENCOUNTER — Ambulatory Visit

## 2023-11-29 ENCOUNTER — Encounter: Payer: Self-pay | Admitting: Internal Medicine

## 2023-11-29 ENCOUNTER — Ambulatory Visit

## 2023-11-29 ENCOUNTER — Ambulatory Visit (INDEPENDENT_AMBULATORY_CARE_PROVIDER_SITE_OTHER): Admitting: Internal Medicine

## 2023-11-29 VITALS — BP 126/66 | HR 78 | Temp 98.3°F | Resp 20 | Wt 178.0 lb

## 2023-11-29 DIAGNOSIS — J310 Chronic rhinitis: Secondary | ICD-10-CM

## 2023-11-29 DIAGNOSIS — R0609 Other forms of dyspnea: Secondary | ICD-10-CM | POA: Diagnosis not present

## 2023-11-29 DIAGNOSIS — R0789 Other chest pain: Secondary | ICD-10-CM

## 2023-11-29 DIAGNOSIS — J4550 Severe persistent asthma, uncomplicated: Secondary | ICD-10-CM | POA: Diagnosis not present

## 2023-11-29 DIAGNOSIS — K219 Gastro-esophageal reflux disease without esophagitis: Secondary | ICD-10-CM

## 2023-11-29 MED ORDER — BREZTRI AEROSPHERE 160-9-4.8 MCG/ACT IN AERO: 2.0000 | INHALATION_SPRAY | Freq: Two times a day (BID) | RESPIRATORY_TRACT | 5 refills | Status: DC

## 2023-11-29 MED ORDER — ALBUTEROL SULFATE HFA 108 (90 BASE) MCG/ACT IN AERS: 2.0000 | INHALATION_SPRAY | Freq: Four times a day (QID) | RESPIRATORY_TRACT | 2 refills | Status: DC | PRN

## 2023-11-29 MED ORDER — FAMOTIDINE 40 MG PO TABS: 40.0000 mg | ORAL_TABLET | Freq: Every day | ORAL | 5 refills | Status: DC

## 2023-11-29 MED ORDER — IPRATROPIUM BROMIDE 0.03 % NA SOLN: 1.0000 | Freq: Every day | NASAL | 5 refills | Status: DC | PRN

## 2023-11-29 MED ORDER — OMEPRAZOLE 40 MG PO CPDR: 40.0000 mg | DELAYED_RELEASE_CAPSULE | Freq: Two times a day (BID) | ORAL | 5 refills | Status: DC

## 2023-11-29 MED ORDER — OLOPATADINE HCL 0.2 % OP SOLN: 1.0000 [drp] | Freq: Every day | OPHTHALMIC | 5 refills | Status: AC | PRN

## 2023-11-29 MED ORDER — MONTELUKAST SODIUM 10 MG PO TABS: 10.0000 mg | ORAL_TABLET | Freq: Every day | ORAL | 5 refills | Status: DC

## 2023-11-29 MED ORDER — FLUTICASONE PROPIONATE 50 MCG/ACT NA SUSP: NASAL | 1 refills | Status: DC

## 2023-11-29 MED ORDER — AZELASTINE HCL 0.1 % NA SOLN: 2.0000 | Freq: Every evening | NASAL | 5 refills | Status: DC

## 2023-11-29 NOTE — Progress Notes (Signed)
 FOLLOW UP Date of Service/Encounter:  11/29/23  Subjective:  [name] (DOB: 10-08-1960) is a 63 y.o. female who [facility] in re-evaluation of the following: asthma, nonallergic rhinitis, GERD History obtained from: chart review and patient.  For Review, LV was on 05/17/23  with Dr.Jordie Skalsky seen for routine follow-up. See below for summary of history and diagnostics.   Therapeutic plans/changes recommended: GI referral placed.  ----------------------------------------------------- Pertinent History/Diagnostics:  Reflux:  Not controlled with omeprazole  40 mg daily, increased to twice daily. Famotidine  later added. GI referral placed. Current meds: Omeprazole  40 mg twice daily, added famotidine  40 mg nightly (11/09/22) Cough and SOB/asthma:  2020 chest x-ray at that time was read as normal. -05/06/2021 spirometry (pre-/post) showed no overt obstruction without bronchodilator response-however had taken albuterol  1 hour prior to testing - 05/06/2021 SPT environmental panel negative, intradermal's negative -[facility] labs: 2023 IgE 56, negative environmental panel, AEC 100.   Teszspire started 02/02/22 and symptoms improved. Still coughing some at night with lying down thought more related to reflux.  Current meds :Breztri  2 puffs BID, tezspire , albuterol  PRN Symptoms improved on Tezspire . Chronic rhinitis Current meds: Flonase , azelastine  nasal sprays and ipratropium nasal sprays, Zyrtec and Pataday  as needed Previous testing negative.  --------------------------------------------------- Today presents for follow-up. Discussed the use of AI scribe software for clinical note transcription with the patient, who gave verbal consent to proceed.  History of Present Illness   [name] is a 63 year old female with asthma who presents with exertional dyspnea.  She experiences significant shortness of breath when walking more than five or six  blocks. During these episodes, her heart beats very fast, causing her to feel drained and necessitating rest before continuing. No chest pain is reported, but she occasionally experiences heaviness in her chest, particularly when active or walking too fast. She has not seen a cardiologist and has not had an echocardiogram or stress test.  She uses albuterol  as needed, approximately three times a day, especially when she starts coughing after walking fast. She also uses Breztri  twice daily, which she finds helpful. No heart palpitations or skipped beats are experienced after using her medication.  She manages gastroesophageal reflux disease with omeprazole  twice daily and famotidine .  She does continue to report ongoing reflux symptoms.  She was previously referred to a gastroenterologist but missed the appointment due to a family emergency and has not rescheduled.  Her social history is significant for the recent loss of her mother in December, which has been emotionally challenging. She is the only family member living outside of [place], where her family resides.   She does feel that overall her coughing episodes and shortness of breath has improved significantly with Tezspire .      All medications reviewed by clinical staff and updated in chart. No new pertinent medical or surgical history except as noted in HPI.  ROS: All others negative except as noted per HPI.   Objective:  BP 126/66 (BP Location: Right Arm, Patient Position: Sitting, Cuff Size: Large)   Pulse 78   Temp 98.3 F (36.8 C) (Oral)   Resp 20   Wt 178 lb (80.7 kg)   BMI 31.53 kg/m  Body mass index is 31.53 kg/m. Physical Exam: General Appearance:  Alert, cooperative, no distress, appears stated age  Head:  Normocephalic, without obvious abnormality, atraumatic  Eyes:  Conjunctiva clear, EOM's intact  Ears EACs normal bilaterally and normal TMs bilaterally  Nose: Nares normal, hypertrophic  turbinates, normal mucosa,  and no visible anterior polyps  Throat: Lips, tongue normal; teeth and gums normal, normal posterior oropharynx  Neck: Supple, symmetrical  Lungs:   clear to auscultation bilaterally, Respirations unlabored, no coughing  Heart:  regular rate and rhythm and no murmur, Appears well perfused  Extremities: No edema  Skin: Skin color, texture, turgor normal and no rashes or lesions on visualized portions of skin  Neurologic: No gross deficits   Labs:  Lab Orders  No laboratory test(s) ordered today    Spirometry:  Tracings reviewed. Her effort: Good reproducible efforts. FVC: 1.71L FEV1: 1.15L, 58% predicted FEV1/FVC ratio: 0.67 Interpretation: Spirometry consistent with possible restrictive disease.  Please see scanned spirometry results for details.  Assessment/Plan   Severe Persistent Asthma-at goal on Tezspire  Concern for cardiac etiology of reproducible dyspnea following 4-5 blocks of walkingwith chest pain/angina? Daily meds:  Breztri  2 puffs twice a day every day Singulair  (montelukast ) 10 mg daily  As needed:  Albuterol  (Proair /Ventolin ) 2 puffs every 4-6 hours as needed and 2 puffs 15 minutes prior to exercise if you have symptoms with activity 3. Continue Tezspire  once monthly  Chronic Rhinitis- Nonallergic: at goal Daily meds:  A. Flonase  (fluticasone ) 2 sprays in each nostril in the morning. Best results if used daily. B. Astelin  (Azelastine ) 2 sprays in each nostril at night    C. Atrovent  (ipatropium) 1 spray each nostril daily as needed - ONLY IF YOU FEEL LIKE YOU HAVE DRAINAGE 2. As needed:  A. Pataday  (Olopatadine ) eye drops B. Cetrizine 10 mg daily as needed.  Reflux: not at goal Omeprazole  40 mg twice a day Famotidine  40 mg nightly 2.  lifestyle modification Referral to gastroenterology for help with reflux control  Dyspnea on exertion/tachycardia and chest pain with walking 4-5 blocks consistently- - refer to cardiology for cardiac  evaluation.  Follow-up in 6 months, sooner if needed.  It was pleasure seeing you again today!  Other: Teszpire given in clinic today.  Jonathon Neighbors, MD  [facility]  

## 2023-11-29 NOTE — Patient Instructions (Addendum)
 Severe Persistent Asthma- Daily meds:  Breztri  2 puffs twice a day every day Singulair  (montelukast ) 10 mg daily  As needed:  Albuterol  (Proair /Ventolin ) 2 puffs every 4-6 hours as needed and 2 puffs 15 minutes prior to exercise if you have symptoms with activity 3. Continue Tezspire  once monthly  Chronic Rhinitis- Nonallergic: Daily meds:  A. Flonase  (fluticasone ) 2 sprays in each nostril in the morning. Best results if used daily. B. Astelin  (Azelastine ) 2 sprays in each nostril at night    C. Atrovent  (ipatropium) 1 spray each nostril daily as needed - ONLY IF YOU FEEL LIKE YOU HAVE DRAINAGE 2. As needed:  A. Pataday  (Olopatadine ) eye drops B. Cetrizine 10 mg daily as needed.  Reflux:  Omeprazole  40 mg twice a day Famotidine  40 mg nightly 2.  lifestyle modification Referral to gastroenterology for help with reflux control  Dyspnea on exertion/tachycardia and chest pain with walking 4-5 blocks consistently - refer to cardiology for cardiac evaluation.  Follow-up in 6 months, sooner if needed.  It was pleasure seeing you again today!  ------------------------------------------------------------------------ Gastroesophageal Reflux Induced Respiratory Disease and Laryngopharyngeal Reflux (LPR): Gastroesophageal reflux disease (GERD) is a condition where the contents of the stomach reflux or back up into the esophagus or swallowing tube.  This can result in a variety of clinical symptoms including classic symptoms and atypical symptoms.  Classic symptoms of GERD include: heartburn, chest pain, acid taste in the mouth, and difficulty in swallowing.  Atypical symptoms of GERD include laryngopharyngeal reflux (LPR) and asthma.  LPR occurs when stomach reflux comes all the way up to the throat.  Clinical symptoms include hoarseness, raspy voice, laryngitis, throat clearing, postnasal drip, mucus stuck in the throat, a sensation of a lump in the throat, sore throat, and cough.  Most  patients with LPR do not have classic symptoms of GERD.  Asthma can also be triggered by GERD.  The acid stomach fluid can stimulate nerve fibers in the esophagus which can cause an increase in bronchial muscle tone and narrowing of the airways.  Acid stomach contents may also reflux into the trachea and bronchi of the lungs where it can trigger an asthma attack.  Many people with GERD triggered asthma do not have classic symptoms of GERD.  Diagnosis of LPR and GERD induced asthma is frequently made from a typical history and response to medications.  It may take several months of medications to see a good response.  Occasionally, a 24-hour esophageal pH probe study must be performed.  Treatment of GERD/LPR includes:   Modification of diet and lifestyle Stop smoking Avoid overeating and lose weight Avoid acidic and fatty foods, chocolate, onions, garlic, peppermint Elevate the head of your bed 6 to 8 inches with blocks or wedge Medications Zantac, Pepcid , Axid, Tagamet Prilosec, Prevacid, Aciphex, Protonix , Nexium Surgery

## 2023-12-20 ENCOUNTER — Telehealth: Payer: Self-pay | Admitting: Internal Medicine

## 2023-12-20 NOTE — Telephone Encounter (Signed)
 Thank you :)

## 2023-12-20 NOTE — Telephone Encounter (Signed)
 [name] has been referred to [name] and [name].  [name] is scheduled with [name] for 01/15/24 at 1:00 pm with [name].  I will follow up with [name] in a week.

## 2023-12-25 ENCOUNTER — Encounter: Payer: Self-pay | Admitting: Physician Assistant

## 2023-12-26 NOTE — Telephone Encounter (Signed)
 [name] has been scheduled with [facility] for 01/15/24 at 1:00 pm with Dr. Ronell Coe.

## 2023-12-27 ENCOUNTER — Ambulatory Visit

## 2023-12-27 DIAGNOSIS — J4550 Severe persistent asthma, uncomplicated: Secondary | ICD-10-CM

## 2024-01-15 ENCOUNTER — Ambulatory Visit: Payer: Self-pay

## 2024-01-16 ENCOUNTER — Other Ambulatory Visit: Payer: Self-pay

## 2024-01-16 ENCOUNTER — Other Ambulatory Visit: Payer: Self-pay | Admitting: *Deleted

## 2024-01-16 MED ORDER — TEZSPIRE 210 MG/1.91ML ~~LOC~~ SOSY: 210.0000 mg | PREFILLED_SYRINGE | SUBCUTANEOUS | 11 refills | Status: DC

## 2024-01-24 ENCOUNTER — Ambulatory Visit

## 2024-01-24 DIAGNOSIS — J4550 Severe persistent asthma, uncomplicated: Secondary | ICD-10-CM | POA: Diagnosis not present

## 2024-02-09 ENCOUNTER — Other Ambulatory Visit: Payer: Self-pay | Admitting: Internal Medicine

## 2024-02-15 ENCOUNTER — Ambulatory Visit: Payer: Self-pay | Admitting: Physician Assistant

## 2024-02-15 NOTE — Progress Notes (Deleted)
 02/15/2024 [name] [id number] 07/07/1961  Referring provider: Waddell Palma, PA-C Primary GI doctor: {acdocs:[id number]}  ASSESSMENT AND PLAN:  GERD  Severe persistent asthma On infusions with [facility] center  Patient Care Team: Waddell Palma RIGGERS as PCP - General (Physician Assistant)  HISTORY OF PRESENT ILLNESS: 63 y.o. female with a past medical history listed below presents for evaluation of ***.   *** Discussed the use of AI scribe software for clinical note transcription with the patient, who gave verbal consent to proceed.  History of Present Illness            She  reports that she has quit smoking. Her smoking use included cigarettes. She has never been exposed to tobacco smoke. She has never used smokeless tobacco. She reports that she does not drink alcohol and does not use drugs.  RELEVANT GI HISTORY, IMAGING AND LABS: Results          CBC    Component Value Date/Time   WBC 9.3 01/16/2022 1639   WBC 10.0 01/21/2014 0040   RBC 4.50 01/16/2022 1639   RBC 3.45 (L) 01/21/2014 0040   HGB 12.4 01/16/2022 1639   HCT 37.3 01/16/2022 1639   PLT 336 01/16/2022 1639   MCV 83 01/16/2022 1639   MCH 27.6 01/16/2022 1639   MCH 29.6 01/21/2014 0040   MCHC 33.2 01/16/2022 1639   MCHC 32.9 01/21/2014 0040   RDW 13.7 01/16/2022 1639   LYMPHSABS 5.3 (H) 01/16/2022 1639   MONOABS 0.4 01/19/2014 1443   EOSABS 0.1 01/16/2022 1639   BASOSABS 0.0 01/16/2022 1639   No results for input(s): HGB in the last 8760 hours.  CMP     Component Value Date/Time   NA 140 01/19/2014 1443   K 4.6 01/19/2014 1443   CL 100 01/19/2014 1443   CO2 30 01/19/2014 1443   GLUCOSE 87 01/19/2014 1443   BUN 10 01/19/2014 1443   CREATININE 0.62 01/20/2014 1600   CALCIUM 9.2 01/20/2014 0645   PROT 8.2 01/19/2014 1443   ALBUMIN  3.8 01/19/2014 1443   AST 25 01/19/2014 1443   ALT 30 01/19/2014 1443   ALKPHOS 167 (H) 01/19/2014 1443   BILITOT 0.2 (L) 01/19/2014 1443    GFRNONAA >90 01/20/2014 1600   GFRAA >90 01/20/2014 1600      Latest Ref Rng & Units 01/19/2014    2:43 PM 05/06/2013    4:25 AM 04/29/2013    8:26 AM  Hepatic Function  Total Protein 6.0 - 8.3 g/dL 8.2  6.4  6.8   Albumin  3.5 - 5.2 g/dL 3.8  2.4  3.4   AST 0 - 37 U/L 25  29  43   ALT 0 - 35 U/L 30  22  31    Alk Phosphatase 39 - 117 U/L 167  58  87   Total Bilirubin 0.3 - 1.2 mg/dL 0.2  0.8  0.3       Current Medications:   Current Outpatient Medications (Endocrine & Metabolic):    levothyroxine  (SYNTHROID , LEVOTHROID) 50 MCG tablet, Take 50 mcg by mouth daily before breakfast.   Current Outpatient Medications (Cardiovascular):    hydrochlorothiazide (HYDRODIURIL) 25 MG tablet, Take 25 mg by mouth daily.   Current Outpatient Medications (Respiratory):    albuterol  (VENTOLIN  HFA) 108 (90 Base) MCG/ACT inhaler, INHALE 2 PUFFS BY MOUTH EVERY 6 HOURS AS NEEDED FOR WHEEZING AND FOR SHORTNESS OF BREATH   azelastine  (ASTELIN ) 0.1 % nasal spray, Place 2 sprays into both nostrils  at bedtime. Use in each nostril as directed   budesonide-glycopyrrolate -formoterol  (BREZTRI  AEROSPHERE) 160-9-4.8 MCG/ACT AERO inhaler, Inhale 2 puffs into the lungs in the morning and at bedtime.   fluticasone  (FLONASE ) 50 MCG/ACT nasal spray, SHAKE LIQUID AND USE 2 SPRAYS IN EACH NOSTRIL EVERY DAY   ipratropium (ATROVENT ) 0.03 % nasal spray, Place 1 spray into both nostrils daily as needed for rhinitis (drainage).   montelukast  (SINGULAIR ) 10 MG tablet, Take 1 tablet (10 mg total) by mouth at bedtime.   tezepelumab -ekko (TEZSPIRE ) 210 MG/1. syringe, Inject 1.91 mLs (210 mg total) into the skin every 28 (twenty-eight) days.  Current Facility-Administered Medications (Respiratory):    tezepelumab -ekko (TEZSPIRE ) 210 MG/1. syringe 210 mg      Current Outpatient Medications (Other):    famotidine  (PEPCID ) 40 MG tablet, Take 1 tablet (40 mg total) by mouth at bedtime.   methocarbamol  (ROBAXIN ) 500 MG  tablet, Take 1-2 tablets (500-1,000 mg total) by mouth every 6 (six) hours as needed for muscle spasms.   Olopatadine  HCl (PATADAY ) 0.2 % SOLN, Place 1 drop into both eyes daily as needed.   omeprazole  (PRILOSEC) 40 MG capsule, Take 1 capsule (40 mg total) by mouth in the morning and at bedtime.   ursodiol (ACTIGALL) 300 MG capsule, Take 300 mg by mouth 2 (two) times daily.   Medical History:  Past Medical History:  Diagnosis Date   Arthritis    Asthma    Depression    Hyperparathyroidism (HCC) 01/22/2014   Hypothyroidism    Vitamin D  insufficiency 01/22/2014   Allergies: No Known Allergies   Surgical History:  She  has a past surgical history that includes ORIF pelvic fracture (N/A, 04/29/2013); External fixation leg (Right, 04/29/2013); Tibia IM nail insertion (Right, 05/01/2013); and ORIF tibia fracture (Right, 01/20/2014). Family History:  Her family history includes Allergic rhinitis in her sister.  REVIEW OF SYSTEMS  : All other systems reviewed and negative except where noted in the History of Present Illness.  PHYSICAL EXAM: There were no vitals taken for this visit. Physical Exam          Alan JONELLE Coombs, PA-C 12:21 PM

## 2024-02-20 LAB — LAB REPORT - SCANNED
A1c: 5.5
EGFR: 102

## 2024-02-21 ENCOUNTER — Ambulatory Visit

## 2024-02-21 DIAGNOSIS — J4550 Severe persistent asthma, uncomplicated: Secondary | ICD-10-CM

## 2024-03-04 ENCOUNTER — Other Ambulatory Visit: Payer: Self-pay | Admitting: Internal Medicine

## 2024-03-12 DIAGNOSIS — M1990 Unspecified osteoarthritis, unspecified site: Secondary | ICD-10-CM | POA: Insufficient documentation

## 2024-03-13 ENCOUNTER — Ambulatory Visit: Attending: Cardiology | Admitting: Cardiology

## 2024-03-13 ENCOUNTER — Encounter: Payer: Self-pay | Admitting: Cardiology

## 2024-03-13 VITALS — BP 120/68 | HR 63 | Ht 62.0 in | Wt 171.0 lb

## 2024-03-13 DIAGNOSIS — E782 Mixed hyperlipidemia: Secondary | ICD-10-CM | POA: Diagnosis not present

## 2024-03-13 DIAGNOSIS — R0609 Other forms of dyspnea: Secondary | ICD-10-CM

## 2024-03-13 DIAGNOSIS — R011 Cardiac murmur, unspecified: Secondary | ICD-10-CM | POA: Diagnosis not present

## 2024-03-13 DIAGNOSIS — E66811 Obesity, class 1: Secondary | ICD-10-CM | POA: Diagnosis not present

## 2024-03-13 MED ORDER — METOPROLOL TARTRATE 50 MG PO TABS: 50.0000 mg | ORAL_TABLET | Freq: Once | ORAL | 0 refills | Status: AC

## 2024-03-13 NOTE — Patient Instructions (Addendum)
 Medication Instructions:  Your physician recommends that you continue on your current medications as directed. Please refer to the Current Medication list given to you today.  *If you need a refill on your cardiac medications before your next appointment, please call your pharmacy*  Lab Work: Your physician recommends that you return for lab work in:   Labs today: BMP  If you have labs (blood work) drawn today and your tests are completely normal, you will receive your results only by: MyChart Message (if you have MyChart) OR A paper copy in the mail If you have any lab test that is abnormal or we need to change your treatment, we will call you to review the results.  Testing/Procedures: Your physician has requested that you have an echocardiogram. Echocardiography is a painless test that uses sound waves to create images of your heart. It provides your doctor with information about the size and shape of your heart and how well your heart's chambers and valves are working. This procedure takes approximately one hour. There are no restrictions for this procedure. Please do NOT wear cologne, perfume, aftershave, or lotions (deodorant is allowed). Please arrive 15 minutes prior to your appointment time.  Please note: We ask at that you not bring children with you during ultrasound (echo/ vascular) testing. Due to room size and safety concerns, children are not allowed in the ultrasound rooms during exams. Our front office staff cannot provide observation of children in our lobby area while testing is being conducted. An adult accompanying a patient to their appointment will only be allowed in the ultrasound room at the discretion of the ultrasound technician under special circumstances. We apologize for any inconvenience.    Your cardiac CT will be scheduled at one of the below locations:   [facility] [address] [place], [place] [address] [phone] (Severe contrast  allergies only)  OR   [facility] [address] [place], [place] [address] [phone]  OR   [facility] [address] [place], [place] [address] [phone]  OR   [facility] and Vascular Tower [address]  [place], [place] [address] [phone]  OR   [facility] [address] [place], [place] [phone]  If scheduled at [facility], please arrive at the [facility] and Children's Entrance (Entrance C2) of [facility] 30 minutes prior to test start time. You can use the FREE valet parking offered at entrance C (encouraged to control the heart rate for the test)  Proceed to the [facility] Department (first floor) to check-in and test prep.  All radiology patients and guests should use entrance C2 at [facility], accessed from [facility], even though the hospital's physical address listed is [address]  If scheduled at the Heart and Vascular Tower at [address], please enter the parking lot using the [address] entrance and use the FREE valet service at the patient drop-off area. Enter the building and check-in with registration on the main floor.  If scheduled at [facility], please arrive to the Heart and Vascular Center 15 mins early for check-in and test prep.  There is spacious parking and easy access to the radiology department from the [facility] Heart and Vascular entrance. Please enter here and check-in with the desk attendant.   If scheduled at [facility], please arrive 30 minutes early for check-in and test prep.  Please  follow these instructions carefully (unless otherwise directed):  An IV will be required for this test and Nitroglycerin will be given.  Hold all erectile dysfunction medications at least 3 days (72 hrs) prior to test. (Ie viagra, cialis, sildenafil, tadalafil, etc)   On the Night  Before the Test: Be sure to Drink plenty of water. Do not consume any caffeinated/decaffeinated beverages or chocolate 12 hours prior to your test. Do not take any antihistamines 12 hours prior to your test.  On the Day of the Test: Drink plenty of water until 1 hour prior to the test. Do not eat any food 1 hour prior to test. You may take your regular medications prior to the test.  Take metoprolol  (Lopressor ) two hours prior to test. If you take Hydrochlorothiazide please HOLD on the morning of the test. Patients who wear a continuous glucose monitor MUST remove the device prior to scanning. FEMALES- please wear underwire-free bra if available, avoid dresses & tight clothing      After the Test: Drink plenty of water. After receiving IV contrast, you may experience a mild flushed feeling. This is normal. On occasion, you may experience a mild rash up to 24 hours after the test. This is not dangerous. If this occurs, you can take Benadryl  25 mg, Zyrtec, Claritin, or Allegra and increase your fluid intake. (Patients taking Tikosyn should avoid Benadryl , and may take Zyrtec, Claritin, or Allegra) If you experience trouble breathing, this can be serious. If it is severe call 911 IMMEDIATELY. If it is mild, please call our office.  We will call to schedule your test 2-4 weeks out understanding that some insurance companies will need an authorization prior to the service being performed.   For more information and frequently asked questions, please visit our website : [email or web address]  For non-scheduling related questions, please contact the cardiac imaging nurse navigator should you have any questions/concerns: Cardiac Imaging Nurse Navigators Direct Office Dial: [phone]   For scheduling needs, including cancellations and rescheduling, please call [place], [phone].   Follow-Up: At [facility], you and your health needs are our priority.  As part of  our continuing mission to provide you with exceptional heart care, our providers are all part of one team.  This team includes your primary Cardiologist (physician) and Advanced Practice Providers or APPs (Physician Assistants and Nurse Practitioners) who all work together to provide you with the care you need, when you need it.  Your next appointment:   1 year(s)  Provider:   Jennifer Crape, MD    We recommend signing up for the patient portal called MyChart.  Sign up information is provided on this After Visit Summary.  MyChart is used to connect with patients for Virtual Visits (Telemedicine).  Patients are able to view lab/test results, encounter notes, upcoming appointments, etc.  Non-urgent messages can be sent to your provider as well.   To learn more about what you can do with MyChart, go to [email or web address].   Other Instructions None
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
## 2024-03-20 ENCOUNTER — Ambulatory Visit

## 2024-03-20 DIAGNOSIS — J4550 Severe persistent asthma, uncomplicated: Secondary | ICD-10-CM

## 2024-03-27 LAB — BASIC METABOLIC PANEL WITH GFR
BUN/Creatinine Ratio: 14 (ref 12–28)
BUN: 9 mg/dL (ref 8–27)
CO2: 26 mmol/L (ref 20–29)
Calcium: 9.4 mg/dL (ref 8.7–10.3)
Chloride: 103 mmol/L (ref 96–106)
Creatinine, Ser: 0.65 mg/dL (ref 0.57–1.00)
Glucose: 86 mg/dL (ref 70–99)
Potassium: 3.7 mmol/L (ref 3.5–5.2)
Sodium: 141 mmol/L (ref 134–144)
eGFR: 98 mL/min/1.73 (ref >59)

## 2024-03-28 ENCOUNTER — Ambulatory Visit (HOSPITAL_COMMUNITY)
Admission: RE | Admit: 2024-03-28 | Discharge: 2024-03-28 | Disposition: A | Discharge status: Home / Self Care | Source: Ambulatory Visit | Attending: Cardiology | Admitting: Cardiology

## 2024-03-28 DIAGNOSIS — I700 Atherosclerosis of aorta: Secondary | ICD-10-CM | POA: Diagnosis not present

## 2024-03-28 DIAGNOSIS — E782 Mixed hyperlipidemia: Secondary | ICD-10-CM | POA: Insufficient documentation

## 2024-03-28 DIAGNOSIS — R011 Cardiac murmur, unspecified: Secondary | ICD-10-CM | POA: Diagnosis not present

## 2024-03-28 DIAGNOSIS — R0609 Other forms of dyspnea: Secondary | ICD-10-CM | POA: Diagnosis present

## 2024-03-28 MED ORDER — IOHEXOL 350 MG/ML SOLN
100.0000 mL | Freq: Once | INTRAVENOUS | Status: AC | PRN
  Administered 2024-03-28: 100 mL via INTRAVENOUS

## 2024-03-28 MED ORDER — NITROGLYCERIN 0.4 MG SL SUBL
0.8000 mg | SUBLINGUAL_TABLET | Freq: Once | SUBLINGUAL | Status: AC
  Administered 2024-03-28: 0.8 mg via SUBLINGUAL

## 2024-04-01 ENCOUNTER — Other Ambulatory Visit: Payer: Self-pay | Admitting: Internal Medicine

## 2024-04-04 ENCOUNTER — Ambulatory Visit: Payer: Self-pay | Admitting: Cardiology

## 2024-04-07 ENCOUNTER — Other Ambulatory Visit: Payer: Self-pay | Admitting: Cardiology

## 2024-04-07 DIAGNOSIS — E782 Mixed hyperlipidemia: Secondary | ICD-10-CM

## 2024-04-07 DIAGNOSIS — R011 Cardiac murmur, unspecified: Secondary | ICD-10-CM

## 2024-04-07 DIAGNOSIS — E66811 Obesity, class 1: Secondary | ICD-10-CM

## 2024-04-07 DIAGNOSIS — R0609 Other forms of dyspnea: Secondary | ICD-10-CM

## 2024-04-10 ENCOUNTER — Encounter: Payer: Self-pay | Admitting: Physician Assistant

## 2024-04-10 ENCOUNTER — Ambulatory Visit: Admitting: Physician Assistant

## 2024-04-10 VITALS — BP 122/62 | HR 68 | Ht 61.0 in | Wt 174.4 lb

## 2024-04-10 DIAGNOSIS — F17210 Nicotine dependence, cigarettes, uncomplicated: Secondary | ICD-10-CM | POA: Diagnosis not present

## 2024-04-10 DIAGNOSIS — K219 Gastro-esophageal reflux disease without esophagitis: Secondary | ICD-10-CM | POA: Diagnosis not present

## 2024-04-10 DIAGNOSIS — R0609 Other forms of dyspnea: Secondary | ICD-10-CM | POA: Diagnosis not present

## 2024-04-10 MED ORDER — FAMOTIDINE 40 MG PO TABS: 40.0000 mg | ORAL_TABLET | Freq: Every day | ORAL | 5 refills | Status: DC

## 2024-04-10 MED ORDER — SUCRALFATE 1 G PO TABS: 1.0000 g | ORAL_TABLET | Freq: Three times a day (TID) | ORAL | 0 refills | Status: AC
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
## 2024-04-10 NOTE — Patient Instructions (Addendum)
 _______________________________________________________  If your blood pressure at your visit was 140/90 or greater, please contact your primary care physician to follow up on this.  _______________________________________________________  If you are age 63 or older, your body mass index should be between 23-30. Your Body mass index is 32.95 kg/m. If this is out of the aforementioned range listed, please consider follow up with your Primary Care Provider.  If you are age 80 or younger, your body mass index should be between 19-25. Your Body mass index is 32.95 kg/m. If this is out of the aformentioned range listed, please consider follow up with your Primary Care Provider.   ________________________________________________________  The [facility] providers would like to encourage you to use MYCHART to communicate with providers for non-urgent requests or questions.  Due to long hold times on the telephone, sending your provider a message by [facility] may be a faster and more efficient way to get a response.  Please allow 48 business hours for a response.  Please remember that this is for non-urgent requests.  _______________________________________________________  [facility] is using a team-based approach to care.  Your team is made up of your doctor and two to three APPS. Our APPS (Nurse Practitioners and Physician Assistants) work with your physician to ensure care continuity for you. They are fully qualified to address your health concerns and develop a treatment plan. They communicate directly with your gastroenterologist to care for you. Seeing the Advanced Practice Practitioners on your physician's team can help you by facilitating care more promptly, often allowing for earlier appointments, access to diagnostic testing, procedures, and other specialty referrals.    You have been scheduled for an abdominal ultrasound at [facility] Main Entrance 1st floor on 04-18-24  at 10am. Please arrive 30 minutes prior to your appointment for registration. Make certain not to have anything to eat or drink midnight prior to your appointment. Should you need to reschedule your appointment, please contact radiology at [phone]. This test typically takes about 30 minutes to perform.  Please take your proton pump inhibitor medication, prilosec 40 mg  Please take this medication 30 minutes to 1 hour before meals- this makes it more effective.  Add pepcid  at night Avoid spicy and acidic foods Avoid fatty foods Limit your intake of coffee, tea, alcohol, and carbonated drinks Work to maintain a healthy weight Keep the head of the bed elevated at least 3 inches with blocks or a wedge pillow if you are having any nighttime symptoms Stay upright for 2 hours after eating Avoid meals and snacks three to four hours before bedtime  Sending a medication called Carafate, this mechanically coats your stomach. Can cause constipation and darker stools. Take about 30 mins to 1 hour before food and before bed.  If the pill is too large to take you can make it more like a liquid by doing this, can dissolve it in warm water,  in small orange juice glass or shot glass and take it more as a liquid.  Reflux Gourmet Rescue  It is an ALGINATE THERAPY which is the only intervention that works to safeguard the esophagus by creating a protective barrier that actually stops reflux from happening. -The general directions for use are as stated on the packaging: Take 1 teaspoon (5 ml), or more as needed or as directed by your physician, after meals and before bed. -These general directions address the most common times for reflux to occur, but our Rescue products may be taken anytime.  Some individuals may take our product preemptively, when they know they will suffer from reflux, or as needed - when discomfort arises. (If taken around food, it should be consumed last.) -You do not have to take 1  teaspoon (5 ml) of the product. While one teaspoon (5ml) may be the perfect average amount to relieve reflux suffering in some, others may require more or less. You may adjust the amount of Mint Chocolate Rescue and Vanilla Caramel Rescue to the lowest amount necessary to meet your individual needs to improve your quality of life. -You may dilute the product if it is too viscous for you to consume. Keep in mind, however, that the thickness of the product was formulated to provide optimal coating and protection of your throat and esophagus. Though diluting the product is possible, it may reduce the protective function and/or length of action. -This can be used in conjunction with reflux medications and lifestyle changes.  100% ALL-NATURAL  Paraben FREE, glycerin FREE, & potassium FREE  Made entirely from all-natural ingredients considered safe for children and during pregnancy  No known side effects  All-natural flavor Gluten FREE  Allergen FREE  Vegan  Can find more information here: [email or web address]   Please call us  when you have the results of your Echo.  You have been scheduled for an endoscopy. Please follow written instructions given to you at your visit today.   If you use inhalers (even only as needed), please bring them with you on the day of your procedure.  If you take any of the following medications, they will need to be adjusted prior to your procedure:   DO NOT TAKE 7 DAYS PRIOR TO TEST- Trulicity (dulaglutide) Ozempic, Wegovy (semaglutide) Mounjaro (tirzepatide) Bydureon Bcise (exanatide extended release)  DO NOT TAKE 1 DAY PRIOR TO YOUR TEST Rybelsus (semaglutide) Adlyxin (lixisenatide) Victoza (liraglutide) Byetta (exanatide) ___________________________________________________________________________

## 2024-04-14 ENCOUNTER — Other Ambulatory Visit (HOSPITAL_BASED_OUTPATIENT_CLINIC_OR_DEPARTMENT_OTHER)

## 2024-04-17 ENCOUNTER — Ambulatory Visit

## 2024-04-17 DIAGNOSIS — J4550 Severe persistent asthma, uncomplicated: Secondary | ICD-10-CM

## 2024-04-18 ENCOUNTER — Ambulatory Visit (HOSPITAL_BASED_OUTPATIENT_CLINIC_OR_DEPARTMENT_OTHER)
Admission: RE | Admit: 2024-04-18 | Discharge: 2024-04-18 | Disposition: A | Discharge status: Home / Self Care | Source: Ambulatory Visit | Attending: Physician Assistant | Admitting: Physician Assistant

## 2024-04-18 DIAGNOSIS — K219 Gastro-esophageal reflux disease without esophagitis: Secondary | ICD-10-CM | POA: Diagnosis present

## 2024-04-21 ENCOUNTER — Ambulatory Visit: Payer: Self-pay | Admitting: Physician Assistant

## 2024-04-25 ENCOUNTER — Other Ambulatory Visit (HOSPITAL_BASED_OUTPATIENT_CLINIC_OR_DEPARTMENT_OTHER)

## 2024-04-29 ENCOUNTER — Ambulatory Visit (HOSPITAL_BASED_OUTPATIENT_CLINIC_OR_DEPARTMENT_OTHER)
Admission: RE | Admit: 2024-04-29 | Discharge: 2024-04-29 | Disposition: A | Discharge status: Home / Self Care | Source: Ambulatory Visit | Attending: Cardiology | Admitting: Cardiology

## 2024-04-29 DIAGNOSIS — R011 Cardiac murmur, unspecified: Secondary | ICD-10-CM | POA: Insufficient documentation

## 2024-04-29 DIAGNOSIS — R0609 Other forms of dyspnea: Secondary | ICD-10-CM | POA: Insufficient documentation

## 2024-04-29 LAB — ECHOCARDIOGRAM COMPLETE
AR max vel: 2.04 cm2
AV Area VTI: 1.8 cm2
AV Area mean vel: 1.65 cm2
AV Mean grad: 3 mmHg
AV Peak grad: 6 mmHg
Ao pk vel: 1.22 m/s
Area-P 1/2: 2.93 cm2
Calc EF: 67.7 %
MV M vel: 3.12 m/s
MV Peak grad: 38.9 mmHg
S' Lateral: 2.6 cm
Single Plane A2C EF: 68.6 %
Single Plane A4C EF: 65.9 %

## 2024-04-30 ENCOUNTER — Ambulatory Visit: Payer: Self-pay | Admitting: Cardiology

## 2024-05-01 ENCOUNTER — Other Ambulatory Visit: Payer: Self-pay | Admitting: Internal Medicine

## 2024-05-06 NOTE — Telephone Encounter (Signed)
 Echo came out great patient is able to proceed with procedure. She was told to call with any questions or concerns.

## 2024-05-14 ENCOUNTER — Ambulatory Visit

## 2024-05-15 ENCOUNTER — Ambulatory Visit

## 2024-05-16 ENCOUNTER — Other Ambulatory Visit: Payer: Self-pay | Admitting: Cardiology

## 2024-05-21 ENCOUNTER — Ambulatory Visit (INDEPENDENT_AMBULATORY_CARE_PROVIDER_SITE_OTHER)

## 2024-05-21 DIAGNOSIS — J4550 Severe persistent asthma, uncomplicated: Secondary | ICD-10-CM | POA: Diagnosis not present

## 2024-05-23 ENCOUNTER — Encounter: Payer: Self-pay | Admitting: Pediatrics
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
## 2024-05-30 ENCOUNTER — Encounter: Payer: Self-pay | Admitting: Pediatrics

## 2024-05-30 ENCOUNTER — Ambulatory Visit: Admitting: Pediatrics

## 2024-05-30 VITALS — BP 117/66 | HR 79 | Temp 97.4°F | Resp 15 | Ht 61.0 in | Wt 174.0 lb

## 2024-05-30 DIAGNOSIS — K219 Gastro-esophageal reflux disease without esophagitis: Secondary | ICD-10-CM | POA: Diagnosis not present

## 2024-05-30 DIAGNOSIS — K3189 Other diseases of stomach and duodenum: Secondary | ICD-10-CM | POA: Diagnosis not present

## 2024-05-30 DIAGNOSIS — R12 Heartburn: Secondary | ICD-10-CM

## 2024-05-30 DIAGNOSIS — R1110 Vomiting, unspecified: Secondary | ICD-10-CM

## 2024-05-30 DIAGNOSIS — K449 Diaphragmatic hernia without obstruction or gangrene: Secondary | ICD-10-CM | POA: Diagnosis not present

## 2024-05-30 MED ORDER — SODIUM CHLORIDE 0.9 % IV SOLN: 500.0000 mL | Freq: Once | INTRAVENOUS | Status: DC

## 2024-05-30 NOTE — Progress Notes (Signed)
 Called to room to assist during endoscopic procedure.  Patient ID and intended procedure confirmed with present staff. Received instructions for my participation in the procedure from the performing physician.

## 2024-05-30 NOTE — Progress Notes (Signed)
 To pacu, VSS. Report to RN.tb

## 2024-05-30 NOTE — Patient Instructions (Addendum)
 Awaiting pathology results. Continue present medications. Return to the GI clinic at scheduled appointment.  YOU HAD AN ENDOSCOPIC PROCEDURE TODAY AT THE [facility]:   Refer to the procedure report that was given to you for any specific questions about what was found during the examination.  If the procedure report does not answer your questions, please call your gastroenterologist to clarify.  If you requested that your care partner not be given the details of your procedure findings, then the procedure report has been included in a sealed envelope for you to review at your convenience later.  YOU SHOULD EXPECT: Some feelings of bloating in the abdomen. Passage of more gas than usual.  Walking can help get rid of the air that was put into your GI tract during the procedure and reduce the bloating. If you had a lower endoscopy (such as a colonoscopy or flexible sigmoidoscopy) you may notice spotting of blood in your stool or on the toilet paper. If you underwent a bowel prep for your procedure, you may not have a normal bowel movement for a few days.  Please Note:  You might notice some irritation and congestion in your nose or some drainage.  This is from the oxygen used during your procedure.  There is no need for concern and it should clear up in a day or so.  SYMPTOMS TO REPORT IMMEDIATELY:  Following upper endoscopy (EGD)  Vomiting of blood or coffee ground material  New chest pain or pain under the shoulder blades  Painful or persistently difficult swallowing  New shortness of breath  Fever of 100F or higher  Black, tarry-looking stools  For urgent or emergent issues, a gastroenterologist can be reached at any hour by calling [phone]. Do not use MyChart messaging for urgent concerns.    DIET:  We do recommend a small meal at first, but then you may proceed to your regular diet.  Drink plenty of fluids but you should avoid alcoholic beverages for 24  hours.  ACTIVITY:  You should plan to take it easy for the rest of today and you should NOT DRIVE or use heavy machinery until tomorrow (because of the sedation medicines used during the test).    FOLLOW UP: Our staff will call the number listed on your records the next business day following your procedure.  We will call around 7:15- 8:00 am to check on you and address any questions or concerns that you may have regarding the information given to you following your procedure. If we do not reach you, we will leave a message.     If any biopsies were taken you will be contacted by phone or by letter within the next 1-3 weeks.  Please call us  at [phone] if you have not heard about the biopsies in 3 weeks.    SIGNATURES/CONFIDENTIALITY: You and/or your care partner have signed paperwork which will be entered into your electronic medical record.  These signatures attest to the fact that that the information above on your After Visit Summary has been reviewed and is understood.  Full responsibility of the confidentiality of this discharge information lies with you and/or your care-partner.

## 2024-05-30 NOTE — Op Note (Signed)
  [facility] Patient Name: [name] Procedure Date: 05/30/2024 1:24 PM MRN: [id number] Endoscopist: Inocente Hausen , MD, [id number] Age: 63 Referring MD:  Date of Birth: 1961-02-13 Gender: Female Account #: [id number] Procedure:                Upper GI endoscopy Indications:              Heartburn, Follow-up of gastro-esophageal reflux                            disease, Regurgitation, Patient reports that                            symptoms have essentially resolved by the time of                            today's exam Medicines:                Monitored Anesthesia Care Procedure:                Pre-Anesthesia Assessment:                           - Prior to the procedure, a History and Physical                            was performed, and patient medications and                            allergies were reviewed. The patient's tolerance of                            previous anesthesia was also reviewed. The risks                            and benefits of the procedure and the sedation                            options and risks were discussed with the patient.                            All questions were answered, and informed consent                            was obtained. Prior Anticoagulants: The patient has                            taken no anticoagulant or antiplatelet agents. ASA                            Grade Assessment: III - A patient with severe                            systemic disease. After reviewing the risks and  benefits, the patient was deemed in satisfactory                            condition to undergo the procedure.                           After obtaining informed consent, the endoscope was                            passed under direct vision. Throughout the                            procedure, the patient's blood pressure, pulse, and                            oxygen saturations were monitored  continuously. The                            Olympus Scope [id number] was introduced through the                            mouth, and advanced to the second part of duodenum.                            The upper GI endoscopy was accomplished without                            difficulty. The patient tolerated the procedure                            well. Scope In: Scope Out: Findings:                 The examined esophagus was normal.                           The gastric body, gastric antrum, cardia (on                            retroflexion) and gastric fundus (on retroflexion)                            were normal. Biopsies were taken with a cold                            forceps for Helicobacter pylori testing.                           A small hiatal hernia was present.                           The duodenal bulb and second portion of the                            duodenum were normal. Complications:  No immediate complications. Estimated blood loss:                            Minimal. Estimated Blood Loss:     Estimated blood loss was minimal. Impression:               - Normal esophagus.                           - Normal gastric body, antrum, cardia and gastric                            fundus. Biopsied.                           - Small hiatal hernia.                           - Normal duodenal bulb and second portion of the                            duodenum. Recommendation:           - Discharge patient to home (ambulatory).                           - Await pathology results.                           - Continue present medications.                           - The findings and recommendations were discussed                            with the patient's family.                           - Return to GI clinic in 2-3 months with Dr.                            Suzann or APP.                           - Patient has a contact number available for                             emergencies. The signs and symptoms of potential                            delayed complications were discussed with the                            patient. Return to normal activities tomorrow.                            Written discharge instructions were provided to the  patient. Inocente Hausen, MD 05/30/2024 1:50:45 PM This report has been signed electronically.

## 2024-06-02 ENCOUNTER — Telehealth: Payer: Self-pay

## 2024-06-02 NOTE — Telephone Encounter (Signed)
No answer on follow up call. 

## 2024-06-04 ENCOUNTER — Encounter: Payer: Self-pay | Admitting: Internal Medicine

## 2024-06-04 ENCOUNTER — Ambulatory Visit: Admitting: Internal Medicine

## 2024-06-04 VITALS — BP 110/60 | HR 83 | Temp 98.4°F | Resp 18 | Wt 173.9 lb

## 2024-06-04 DIAGNOSIS — K219 Gastro-esophageal reflux disease without esophagitis: Secondary | ICD-10-CM | POA: Diagnosis not present

## 2024-06-04 DIAGNOSIS — J310 Chronic rhinitis: Secondary | ICD-10-CM

## 2024-06-04 DIAGNOSIS — J4550 Severe persistent asthma, uncomplicated: Secondary | ICD-10-CM

## 2024-06-04 LAB — SURGICAL PATHOLOGY

## 2024-06-04 MED ORDER — FAMOTIDINE 40 MG PO TABS: 40.0000 mg | ORAL_TABLET | Freq: Every day | ORAL | 5 refills | Status: AC

## 2024-06-04 MED ORDER — BREZTRI AEROSPHERE 160-9-4.8 MCG/ACT IN AERO: 2.0000 | INHALATION_SPRAY | Freq: Two times a day (BID) | RESPIRATORY_TRACT | 5 refills | Status: AC

## 2024-06-04 MED ORDER — IPRATROPIUM BROMIDE 0.03 % NA SOLN: 1.0000 | Freq: Every day | NASAL | 5 refills | Status: AC | PRN

## 2024-06-04 MED ORDER — AZELASTINE HCL 0.1 % NA SOLN: 2.0000 | Freq: Every evening | NASAL | 5 refills | Status: AC

## 2024-06-04 MED ORDER — OMEPRAZOLE 40 MG PO CPDR: 40.0000 mg | DELAYED_RELEASE_CAPSULE | Freq: Two times a day (BID) | ORAL | 5 refills | Status: DC

## 2024-06-04 MED ORDER — MONTELUKAST SODIUM 10 MG PO TABS: 10.0000 mg | ORAL_TABLET | Freq: Every day | ORAL | 5 refills | Status: AC

## 2024-06-04 MED ORDER — FLUTICASONE PROPIONATE 50 MCG/ACT NA SUSP: NASAL | 1 refills | Status: AC

## 2024-06-04 MED ORDER — ALBUTEROL SULFATE HFA 108 (90 BASE) MCG/ACT IN AERS: 2.0000 | INHALATION_SPRAY | RESPIRATORY_TRACT | 1 refills | Status: AC | PRN

## 2024-06-04 NOTE — Progress Notes (Signed)
 FOLLOW UP Date of Service/Encounter:   06/04/2024  Subjective:  [name] (DOB: 1961-03-19) is a 63 y.o. female who [facility] in re-evaluation of the following: reflux, cough and SOB, asthma, chronic rhinitis History obtained from: chart review and patient.  For Review, LV was on 11/29/23  with Dr.Toniqua Melamed seen for routine follow-up. See below for summary of history and diagnostics.   Therapeutic plans/changes recommended: having ongoing dyspnea on Tezspire  referred to cardiology for work-up. Echo resulted as unremarkable. ----------------------------------------------------- Pertinent History/Diagnostics:  Reflux:  Not controlled with omeprazole  40 mg daily, increased to twice daily. Famotidine  later added. GI referral placed. Current meds: Omeprazole  40 mg twice daily, added famotidine  40 mg nightly (11/09/22) - EGD 05/30/24: normal esophagus, duodenom and stomach with small hiatal hernia, pathology report pending. Cough and SOB/asthma:  2020 chest x-ray at that time was read as normal. -05/06/2021 spirometry (pre-/post) showed no overt obstruction without bronchodilator response-however had taken albuterol  1 hour prior to testing - 05/06/2021 SPT environmental panel negative, intradermal's negative -[facility] labs: 2023 IgE 56, negative environmental panel, AEC 100.   Teszspire started 02/02/22 and symptoms improved. Still coughing some at night with lying down thought more related to reflux.  Current meds :Breztri  2 puffs BID, tezspire , albuterol  PRN Symptoms improved on Tezspire . - 11/29/23: reported ongoing dyspnea, referred to cardiology, normal echocardiogram Chronic rhinitis Current meds: Flonase , azelastine  nasal sprays and ipratropium nasal sprays, Zyrtec and Pataday  as needed Previous testing negative.  --------------------------------------------------- Today presents for follow-up. Discussed the use of AI scribe software  for clinical note transcription with the patient, who gave verbal consent to proceed.  History of Present Illness [name] is a 63 year old female with asthma who presents for follow-up on her breathing and medication management.  Asthma symptoms and medication management - Asthma symptoms are currently controlled with the current treatment regimen. - Shortness of breath occurs after walking a few blocks. - Rescue inhaler is used every couple of days. - Recently discontinued Breztri  inhaler (previously two puffs twice daily).  She was told by a provider at [facility] to stop this medication although it is unclear why - Not currently taking montelukast  due to lack of refill. -She has seen cardiology but does not have a scheduled follow-up.   Allergic rhinitis and ocular symptoms - Uses a nasal spray for nasal symptoms which is helpful but she is unsure which. - Does not take cetirizine or Zyrtec. - Requires eye drops for ocular symptoms, but she is unclear which.  Gastroesophageal reflux symptoms - Acid reflux symptoms remain unchanged. - Awaiting biopsy results from a recent endoscopy performed by gastroenterology. - Follow-up appointment with gastroenterology scheduled in January.   All medications reviewed by clinical staff and updated in chart. No new pertinent medical or surgical history except as noted in HPI.  ROS: All others negative except as noted per HPI.   Objective:  BP 110/60   Pulse 83   Temp 98.4 F (36.9 C)   Resp 18   Wt 173 lb 14.4 oz (78.9 kg)   SpO2 100%   BMI 32.86 kg/m  Body mass index is 32.86 kg/m. Physical Exam: General Appearance:  Alert, cooperative, no distress, appears stated age  Head:  Normocephalic, without obvious abnormality, atraumatic  Eyes:  Conjunctiva clear, EOM's intact  Ears EACs normal bilaterally and normal TMs bilaterally  Nose: Nares normal, hypertrophic turbinates, normal mucosa, and no visible anterior polyps   Throat: Lips,  tongue normal; teeth and gums normal, normal posterior oropharynx  Neck: Supple, symmetrical  Lungs:   clear to auscultation bilaterally, Respirations unlabored, no coughing  Heart:  regular rate and rhythm and no murmur, Appears well perfused  Extremities: No edema  Skin: Skin color, texture, turgor normal and no rashes or lesions on visualized portions of skin  Neurologic: No gross deficits   Labs:  Lab Orders  No laboratory test(s) ordered today    Spirometry:  Tracings reviewed. Her effort: Good reproducible efforts. FVC: 2.13L FEV1: 1.60L, 86% predicted FEV1/FVC ratio: 0.75 Interpretation: Spirometry consistent with normal pattern.  Please see scanned spirometry results for details.  Assessment/Plan   Severe Persistent Asthma-doing well but not taking controller medications, stopped by another provider for unclear reasons.  Daily meds:  Breztri  2 puffs twice a day every day Singulair  (montelukast ) 10 mg daily  As needed:  Albuterol  (Proair /Ventolin ) 2 puffs every 4-6 hours as needed and 2 puffs 15 minutes prior to exercise if you have symptoms with activity 3. Continue Tezspire  once monthly  Chronic Rhinitis- Nonallergic: at goal Daily meds:  A. Flonase  (fluticasone ) 2 sprays in each nostril in the morning. Best results if used daily. B. Astelin  (Azelastine ) 2 sprays in each nostril at night    C. Atrovent  (ipatropium) 1 spray each nostril daily as needed - ONLY IF YOU FEEL LIKE YOU HAVE DRAINAGE 2. As needed:  A. Pataday  (Olopatadine ) eye drops B. Cetrizine 10 mg daily as needed.  Reflux: stable Omeprazole  40 mg twice a day Famotidine  40 mg nightly 2.  lifestyle modification Referral to gastroenterology for help with reflux control  Dyspnea on exertion/tachycardia and chest pain with walking 4-5 blocks consistently - continue follow-up with cardiology  Follow-up in 6 months, sooner if needed.  It was pleasure seeing you again today!  Other:  sample of Breztri  given.  Rocky Endow, MD  [facility] 

## 2024-06-04 NOTE — Patient Instructions (Addendum)
 Severe Persistent Asthma-at goal Daily meds:  Breztri  2 puffs twice a day every day Singulair  (montelukast ) 10 mg daily  As needed:  Albuterol  (Proair /Ventolin ) 2 puffs every 4-6 hours as needed and 2 puffs 15 minutes prior to exercise if you have symptoms with activity 3. Continue Tezspire  once monthly  Chronic Rhinitis- Nonallergic: at goal Daily meds:  A. Flonase  (fluticasone ) 2 sprays in each nostril in the morning. Best results if used daily. B. Astelin  (Azelastine ) 2 sprays in each nostril at night    C. Atrovent  (ipatropium) 1 spray each nostril daily as needed - ONLY IF YOU FEEL LIKE YOU HAVE DRAINAGE 2. As needed:  A. Pataday  (Olopatadine ) eye drops B. Cetrizine 10 mg daily as needed.  Reflux: stable Omeprazole  40 mg twice a day Famotidine  40 mg nightly 2.  lifestyle modification Referral to gastroenterology for help with reflux control  Dyspnea on exertion/tachycardia and chest pain with walking 4-5 blocks consistently - continue follow-up with cardiology  Follow-up in 6 months, sooner if needed.  It was pleasure seeing you again today!  ------------------------------------------------------------------------ Gastroesophageal Reflux Induced Respiratory Disease and Laryngopharyngeal Reflux (LPR): Gastroesophageal reflux disease (GERD) is a condition where the contents of the stomach reflux or back up into the esophagus or swallowing tube.  This can result in a variety of clinical symptoms including classic symptoms and atypical symptoms.  Classic symptoms of GERD include: heartburn, chest pain, acid taste in the mouth, and difficulty in swallowing.  Atypical symptoms of GERD include laryngopharyngeal reflux (LPR) and asthma.  LPR occurs when stomach reflux comes all the way up to the throat.  Clinical symptoms include hoarseness, raspy voice, laryngitis, throat clearing, postnasal drip, mucus stuck in the throat, a sensation of a lump in the throat, sore throat, and cough.   Most patients with LPR do not have classic symptoms of GERD.  Asthma can also be triggered by GERD.  The acid stomach fluid can stimulate nerve fibers in the esophagus which can cause an increase in bronchial muscle tone and narrowing of the airways.  Acid stomach contents may also reflux into the trachea and bronchi of the lungs where it can trigger an asthma attack.  Many people with GERD triggered asthma do not have classic symptoms of GERD.  Diagnosis of LPR and GERD induced asthma is frequently made from a typical history and response to medications.  It may take several months of medications to see a good response.  Occasionally, a 24-hour esophageal pH probe study must be performed.  Treatment of GERD/LPR includes:   Modification of diet and lifestyle Stop smoking Avoid overeating and lose weight Avoid acidic and fatty foods, chocolate, onions, garlic, peppermint Elevate the head of your bed 6 to 8 inches with blocks or wedge Medications Zantac, Pepcid , Axid, Tagamet Prilosec, Prevacid, Aciphex, Protonix , Nexium Surgery

## 2024-06-07 ENCOUNTER — Ambulatory Visit: Payer: Self-pay | Admitting: Pediatrics

## 2024-06-18 ENCOUNTER — Ambulatory Visit (INDEPENDENT_AMBULATORY_CARE_PROVIDER_SITE_OTHER)

## 2024-06-18 DIAGNOSIS — J4550 Severe persistent asthma, uncomplicated: Secondary | ICD-10-CM | POA: Diagnosis not present

## 2024-07-16 ENCOUNTER — Ambulatory Visit

## 2024-07-23 ENCOUNTER — Other Ambulatory Visit: Payer: Self-pay | Admitting: *Deleted

## 2024-07-23 ENCOUNTER — Other Ambulatory Visit (HOSPITAL_COMMUNITY): Payer: Self-pay

## 2024-07-23 ENCOUNTER — Telehealth: Payer: Self-pay

## 2024-07-23 ENCOUNTER — Ambulatory Visit

## 2024-07-23 MED ORDER — TEZSPIRE 210 MG/1.91ML ~~LOC~~ SOSY: 210.0000 mg | PREFILLED_SYRINGE | SUBCUTANEOUS | 11 refills | Status: AC

## 2024-07-23 NOTE — Telephone Encounter (Signed)
 Refill sent to [facility]

## 2024-07-24 NOTE — Telephone Encounter (Signed)
 Genna will let patient know. Thank you

## 2024-07-24 NOTE — Telephone Encounter (Signed)
 Pt have been schedule for 1/21 @3p  and she's aware.

## 2024-07-30 ENCOUNTER — Telehealth: Payer: Self-pay

## 2024-07-30 ENCOUNTER — Ambulatory Visit

## 2024-07-30 NOTE — Telephone Encounter (Signed)
 Pt called to have her tezspire  taken out. Advised it's not here. Pt thought we had spoken to someone about it being here and she was scheduled. Advised that [facility] called us  and advised she needed new rx which was sent on 1/14. Advised she call [facility] to get a ship date from so she knows when to reschedule.

## 2024-07-30 NOTE — Telephone Encounter (Signed)
 Patient called states she has tezpire visit today biut spoke to pharmacy and shipment date is on 08/05/24 what should she do? I checked injection room and there is no medication with her name her. I rescheduled for next Wednesday 1/28 at 3pm, we will call if it does not arrive due to inclimate weather. She was satisfied with that plan.

## 2024-08-06 ENCOUNTER — Ambulatory Visit
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
## 2024-08-08 ENCOUNTER — Encounter: Payer: Self-pay | Admitting: Pediatrics

## 2024-08-08 ENCOUNTER — Ambulatory Visit: Admitting: Pediatrics

## 2024-08-08 VITALS — BP 122/72 | HR 67 | Ht 61.0 in | Wt 179.0 lb

## 2024-08-08 DIAGNOSIS — Z1211 Encounter for screening for malignant neoplasm of colon: Secondary | ICD-10-CM

## 2024-08-08 DIAGNOSIS — R053 Chronic cough: Secondary | ICD-10-CM

## 2024-08-08 DIAGNOSIS — K219 Gastro-esophageal reflux disease without esophagitis: Secondary | ICD-10-CM | POA: Diagnosis not present

## 2024-08-08 DIAGNOSIS — R12 Heartburn: Secondary | ICD-10-CM

## 2024-08-08 DIAGNOSIS — K449 Diaphragmatic hernia without obstruction or gangrene: Secondary | ICD-10-CM | POA: Diagnosis not present

## 2024-08-08 MED ORDER — FAMOTIDINE 40 MG PO TABS: 40.0000 mg | ORAL_TABLET | Freq: Every day | ORAL | 3 refills | Status: AC

## 2024-08-08 MED ORDER — OMEPRAZOLE 40 MG PO CPDR: 40.0000 mg | DELAYED_RELEASE_CAPSULE | Freq: Two times a day (BID) | ORAL | 3 refills | Status: AC

## 2024-08-08 NOTE — Patient Instructions (Signed)
 We have sent the following medications to your pharmacy for you to pick up at your convenience:  Omeprazole , Pepcid .  Make sure you are taking the Omeprazole  30 minutes before breakfast and dinner.  Please follow up in 6 -12 months  _______________________________________________________  If your blood pressure at your visit was 140/90 or greater, please contact your primary care physician to follow up on this.  _______________________________________________________  If you are age 65 or older, your body mass index should be between 23-30. Your Body mass index is 33.82 kg/m. If this is out of the aforementioned range listed, please consider follow up with your Primary Care Provider.  If you are age 65 or younger, your body mass index should be between 19-25. Your Body mass index is 33.82 kg/m. If this is out of the aformentioned range listed, please consider follow up with your Primary Care Provider.   ________________________________________________________  The [facility] providers would like to encourage you to use MYCHART to communicate with providers for non-urgent requests or questions.  Due to long hold times on the telephone, sending your provider a message by [facility] may be a faster and more efficient way to get a response.  Please allow 48 business hours for a response.  Please remember that this is for non-urgent requests.  _______________________________________________________  [facility] is using a team-based approach to care.  Your team is made up of your doctor and two to three APPS. Our APPS (Nurse Practitioners and Physician Assistants) work with your physician to ensure care continuity for you. They are fully qualified to address your health concerns and develop a treatment plan. They communicate directly with your gastroenterologist to care for you. Seeing the Advanced Practice Practitioners on your physician's team can help you by facilitating care more  promptly, often allowing for earlier appointments, access to diagnostic testing, procedures, and other specialty referrals.

## 2024-08-11 ENCOUNTER — Telehealth: Payer: Self-pay | Admitting: *Deleted

## 2024-08-15 ENCOUNTER — Ambulatory Visit

## 2024-08-15 DIAGNOSIS — J4550 Severe persistent asthma, uncomplicated: Secondary | ICD-10-CM

## 2024-09-12 ENCOUNTER — Ambulatory Visit

## 2024-12-03 ENCOUNTER — Ambulatory Visit: Admitting: Internal Medicine
